# Patient Record
Sex: Female | Born: 1970 | Race: White | Hispanic: No | Marital: Married | Smoking: Former smoker
Health system: Southern US, Community
[De-identification: ages and names within clinical notes are randomized; demographics above are authoritative.]

## PROBLEM LIST (undated history)

## (undated) DIAGNOSIS — J309 Allergic rhinitis, unspecified: Secondary | ICD-10-CM

## (undated) DIAGNOSIS — F329 Major depressive disorder, single episode, unspecified: Secondary | ICD-10-CM

## (undated) DIAGNOSIS — K219 Gastro-esophageal reflux disease without esophagitis: Secondary | ICD-10-CM

## (undated) DIAGNOSIS — J45909 Unspecified asthma, uncomplicated: Secondary | ICD-10-CM

## (undated) DIAGNOSIS — F32A Depression, unspecified: Secondary | ICD-10-CM

## (undated) HISTORY — DX: Unspecified asthma, uncomplicated: J45.909

## (undated) HISTORY — DX: Major depressive disorder, single episode, unspecified: F32.9

## (undated) HISTORY — DX: Allergic rhinitis, unspecified: J30.9

## (undated) HISTORY — PX: CHOLECYSTECTOMY: SHX55

## (undated) HISTORY — PX: ABDOMINAL HYSTERECTOMY: SHX81

## (undated) HISTORY — DX: Gastro-esophageal reflux disease without esophagitis: K21.9

## (undated) HISTORY — DX: Depression, unspecified: F32.A

---

## 2006-05-24 ENCOUNTER — Emergency Department: Payer: Self-pay | Admitting: Emergency Medicine

## 2007-01-04 ENCOUNTER — Ambulatory Visit: Payer: Self-pay

## 2007-01-15 ENCOUNTER — Emergency Department: Payer: Self-pay | Admitting: General Practice

## 2007-03-14 ENCOUNTER — Ambulatory Visit: Payer: Self-pay | Admitting: Internal Medicine

## 2007-11-16 ENCOUNTER — Emergency Department: Payer: Self-pay | Admitting: Emergency Medicine

## 2008-02-22 ENCOUNTER — Emergency Department: Payer: Self-pay | Admitting: Emergency Medicine

## 2008-08-18 ENCOUNTER — Emergency Department: Payer: Self-pay

## 2011-03-10 ENCOUNTER — Ambulatory Visit: Payer: Self-pay

## 2011-07-09 ENCOUNTER — Emergency Department: Payer: Self-pay | Admitting: Unknown Physician Specialty

## 2013-06-26 ENCOUNTER — Emergency Department: Payer: Self-pay | Admitting: Emergency Medicine

## 2013-06-26 LAB — COMPREHENSIVE METABOLIC PANEL
Alkaline Phosphatase: 54 U/L
BUN: 11 mg/dL (ref 7–18)
Bilirubin,Total: 0.5 mg/dL (ref 0.2–1.0)
Chloride: 104 mmol/L (ref 98–107)
Creatinine: 0.62 mg/dL (ref 0.60–1.30)
EGFR (African American): >60
Glucose: 94 mg/dL (ref 65–99)
Osmolality: 275 (ref 275–301)
SGPT (ALT): 24 U/L (ref 12–78)
Total Protein: 6.7 g/dL (ref 6.4–8.2)

## 2013-06-26 LAB — TROPONIN I: Troponin-I: <0.02 ng/mL

## 2013-06-26 LAB — CBC
HCT: 34.3 % — ABNORMAL LOW (ref 35.0–47.0)
HGB: 11.8 g/dL — ABNORMAL LOW (ref 12.0–16.0)
MCH: 30.4 pg (ref 26.0–34.0)
Platelet: 202 10*3/uL (ref 150–440)
RBC: 3.89 10*6/uL (ref 3.80–5.20)
WBC: 7 10*3/uL (ref 3.6–11.0)

## 2013-07-11 ENCOUNTER — Emergency Department: Payer: Self-pay | Admitting: Emergency Medicine

## 2013-07-11 LAB — CBC
HCT: 35.8 % (ref 35.0–47.0)
HGB: 12.1 g/dL (ref 12.0–16.0)
MCH: 29.8 pg (ref 26.0–34.0)
MCHC: 33.8 g/dL (ref 32.0–36.0)
Platelet: 194 10*3/uL (ref 150–440)

## 2013-07-11 LAB — BASIC METABOLIC PANEL
BUN: 12 mg/dL (ref 7–18)
Calcium, Total: 8.5 mg/dL (ref 8.5–10.1)
Chloride: 104 mmol/L (ref 98–107)
Co2: 28 mmol/L (ref 21–32)
EGFR (African American): >60
Glucose: 118 mg/dL — ABNORMAL HIGH (ref 65–99)
Potassium: 3.7 mmol/L (ref 3.5–5.1)
Sodium: 137 mmol/L (ref 136–145)

## 2013-07-11 LAB — TROPONIN I: Troponin-I: <0.02 ng/mL

## 2014-03-30 ENCOUNTER — Emergency Department: Payer: Self-pay | Admitting: Emergency Medicine

## 2016-04-10 ENCOUNTER — Encounter (HOSPITAL_COMMUNITY): Payer: Self-pay

## 2016-04-23 ENCOUNTER — Ambulatory Visit (INDEPENDENT_AMBULATORY_CARE_PROVIDER_SITE_OTHER): Payer: Managed Care, Other (non HMO) | Admitting: Psychiatry

## 2016-04-23 DIAGNOSIS — F324 Major depressive disorder, single episode, in partial remission: Secondary | ICD-10-CM | POA: Diagnosis not present

## 2016-04-23 MED ORDER — DULOXETINE HCL 40 MG PO CPEP: 80.0000 mg | ORAL_CAPSULE | Freq: Every day | ORAL | 1 refills | Status: DC
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
## 2016-05-06 ENCOUNTER — Telehealth: Payer: Self-pay

## 2016-05-06 NOTE — Telephone Encounter (Signed)
pt states she not doing well on the new medication dosage.  pt wants it changed back.

## 2016-05-06 NOTE — Telephone Encounter (Signed)
called pt she needs 60mg  of duloxetine.  pt states that they have tired before so she wants to stay at 60mg .

## 2016-05-11 NOTE — Telephone Encounter (Signed)
per dr. Daleen Boravi pt will need to come in to make any medication changes.  Pt is very upset and mad because dr. Daleen Boravi would not just go ahead and make changes.  And pt asked why is she making me come back in .  Pt was offered a appt for Thursday but pt states she could not do that date.

## 2016-05-11 NOTE — Telephone Encounter (Signed)
pt called again. pt states she needs to know what to do about her medications.

## 2016-05-21 ENCOUNTER — Ambulatory Visit: Payer: Managed Care, Other (non HMO) | Admitting: Psychiatry

## 2018-07-13 ENCOUNTER — Ambulatory Visit (INDEPENDENT_AMBULATORY_CARE_PROVIDER_SITE_OTHER): Payer: BLUE CROSS/BLUE SHIELD | Admitting: Physician Assistant

## 2018-07-13 ENCOUNTER — Encounter: Payer: Self-pay | Admitting: Physician Assistant

## 2018-07-13 DIAGNOSIS — K219 Gastro-esophageal reflux disease without esophagitis: Secondary | ICD-10-CM

## 2018-07-13 DIAGNOSIS — G4700 Insomnia, unspecified: Secondary | ICD-10-CM

## 2018-07-13 DIAGNOSIS — F331 Major depressive disorder, recurrent, moderate: Secondary | ICD-10-CM

## 2018-07-13 MED ORDER — ARIPIPRAZOLE 2 MG PO TABS: ORAL_TABLET | ORAL | 1 refills | Status: DC

## 2018-07-13 MED ORDER — TRAZODONE HCL 50 MG PO TABS: 25.0000 mg | ORAL_TABLET | Freq: Every evening | ORAL | 1 refills | Status: DC | PRN

## 2018-07-13 NOTE — Progress Notes (Signed)
[facility] MD/PA/NP Initial Note  07/13/2018 6:02 PM Wendy SlateMary K Wright  MRN:  [id number]  Chief Complaint:  Chief Complaint    Depression; ADD      HPI: c/o depression her whole life.  Never happy.  Worse in the past year.  Being treated by PCP. Having marital issues. He works out of state so it's been very stressful.   Patient has loss of interest in usual activities and is unable to enjoy things.  This hasn't really gotten any worse.  Has decreased energy and motivation.  Appetite has not changed.  c/o sadness, tearfulness.  C/o changes in concentration, and  remembering things. "I've always been that way."  Never dx with ADD/ADHD. Denies suicidal or homicidal thoughts. Has been on Cymbalta for 20 years and is nervous to try anything else. Dose was increased at one point and she didn't tolerate it.   Patient reports increased energy with decreased need for sleep,  increased talkativeness.  These sx seem to come and go with seasons. Has racing thoughts mostly at night,  denies impulsivity or risky behaviors, no increased spending, no increased libido, no grandiosity.   Trouble sleeping.  Has racing thoughts in the evening and can't turn her mind off. Almost every night. Sometimes uses OTC med which helps and has had Ambien in the past which helped.  Gets about 6 hours a night now.  Misses the sleep.  Visit Diagnosis:    ICD-10-CM   1. Major depressive disorder, recurrent episode, moderate (HCC) F33.1   2. Insomnia, unspecified type G47.00   3. Gastroesophageal reflux disease, esophagitis presence not specified K21.9     Past Psychiatric History:  Past medications for mental health diagnoses include: Prozac, Lexapro, Zoloft, Paxil, Effexor, Wellbutrin, Ambien, Melatonin,   Past Medical History:  Past Medical History:  Diagnosis Date  . Asthma   . Chronic allergic rhinitis   . Depression   . GERD (gastroesophageal reflux disease)    No hospitalizations for mental health reasons    Past Surgical History:  Procedure Laterality Date  . ABDOMINAL HYSTERECTOMY    . CHOLECYSTECTOMY      Family Psychiatric History:   Family History:  Family History  Problem Relation Age of Onset  . Alcohol abuse Father   . Cirrhosis Father   . Dementia Paternal Grandmother     Social History:  Social History   Socioeconomic History  . Marital status: Married    Spouse name: Casimiro NeedleMichael  . Number of children: 3  . Years of education: Not on file  . Highest education level: GED or equivalent  Occupational History  . Occupation: Retail    Comment: [employer]  Social Needs  . Financial resource strain: Not hard at all  . Food insecurity:    Worry: Never true    Inability: Never true  . Transportation needs:    Medical: No    Non-medical: No  Tobacco Use  . Smoking status: Former Smoker    Types: Cigarettes  . Smokeless tobacco: Never Used  Substance and Sexual Activity  . Alcohol use: Yes    Comment: occasionally   . Drug use: Not Currently    Types: Marijuana  . Sexual activity: Yes    Birth control/protection: Surgical  Lifestyle  . Physical activity:    Days per week: 0 days    Minutes per session: 0 min  . Stress: To some extent  Relationships  . Social connections:    Talks on phone:  Never    Gets together: Never    Attends religious service: 1 to 4 times per year    Active member of club or organization: Yes    Attends meetings of clubs or organizations: Never    Relationship status: Married  Other Topics Concern  . Not on file  Social History Narrative   No h/o sexual abuse   No legal issues ever.      Grew up with Mom and GM.  Never abused.    Believes in God but doesn't go to church.   Has 1/2 brother and 1/2 sister.       Lives with husband and 2 kids. Married x 26 years.    Having marital issues.  Husband is in [place] for job.    Allergies: No Known Allergies  Metabolic Disorder Labs: No results found for: HGBA1C, MPG No  results found for: PROLACTIN No results found for: CHOL, TRIG, HDL, CHOLHDL, VLDL, LDLCALC No results found for: TSH  Therapeutic Level Labs: No results found for: LITHIUM No results found for: VALPROATE No components found for:  CBMZ  Current Medications: Current Outpatient Medications  Medication Sig Dispense Refill  . DULoxetine (CYMBALTA) 60 MG capsule Take 60 mg by mouth daily.    . fexofenadine (ALLEGRA) 180 MG tablet Take 180 mg by mouth daily.    Marland Kitchen omeprazole (PRILOSEC) 20 MG capsule Take 20 mg by mouth daily.    . ARIPiprazole (ABILIFY) 2 MG tablet 1 po q am for a week, then 2 q am. 60 tablet 1  . traZODone (DESYREL) 50 MG tablet Take 0.5-1 tablets (25-50 mg total) by mouth at bedtime as needed for sleep. 30 tablet 1   No current facility-administered medications for this visit.     Medication Side Effects: none  Orders placed this visit:  No orders of the defined types were placed in this encounter.   Psychiatric Specialty Exam:  ROS  There were no vitals taken for this visit.There is no height or weight on file to calculate BMI.  General Appearance: Casual and Well Groomed  Eye Contact:  Good  Speech:  Clear and Coherent  Volume:  Normal  Mood:  Euthymic  Affect:  Appropriate  Thought Process:  Goal Directed  Orientation:  Full (Time, Place, and Person)  Thought Content: Logical   Suicidal Thoughts:  No  Homicidal Thoughts:  No  Memory:  WNL  Judgement:  Good  Insight:  Good  Psychomotor Activity:  Normal  Concentration:  Concentration: Good and Attention Span: Good  Recall:  Good  Fund of Knowledge: Good  Language: Good  Assets:  Communication Skills  ADL's:  Intact  Cognition: WNL  Prognosis:  Good   Screenings:  GAD-7     Counselor from 07/13/2018 in [facility]  Total GAD-7 Score  10    PHQ2-9     Counselor from 07/13/2018 in [facility]  PHQ-2 Total Score  4  PHQ-9 Total Score  17      Receiving  Psychotherapy: Yes saw a counselor once at [facility].   Treatment Plan/Recommendations: We will continue Cymbalta 60 mg.  If it had not been tried before, I would increase the dose, however she states she did not tolerate it well. Added Abilify 2 mg p.o. every morning for 1 week and then 2 p.o. every morning. Discussed potential metabolic side effects associated with atypical antipsychotics.  Labs will need to be drawn periodically to monitor metabolic function. Discussed potential  risk for movement side effects. Patient understands and accepts these risks and has been advised to contact office if any movement side effects occur.  Add trazodone 50 mg half to 1 p.o. nightly as needed sleep. We discussed good sleep hygiene and asked her to put those practices in place. Either continue counseling at [facility] or see someone here.  She had ask about that and might make an appointment.  She is unsure. Return in 4 to 6 weeks or sooner as needed.    [name], PA-C

## 2018-07-15 ENCOUNTER — Encounter: Payer: Self-pay | Admitting: Psychiatry

## 2018-07-21 ENCOUNTER — Telehealth: Payer: Self-pay

## 2018-07-21 NOTE — Telephone Encounter (Signed)
Prior authorization approval through [facility], for aripiprazole 2mg  through 07/18/2019.

## 2018-08-23 ENCOUNTER — Ambulatory Visit: Payer: BLUE CROSS/BLUE SHIELD | Admitting: Physician Assistant

## 2018-08-23 ENCOUNTER — Encounter: Payer: Self-pay | Admitting: Physician Assistant

## 2018-08-23 DIAGNOSIS — G4700 Insomnia, unspecified: Secondary | ICD-10-CM | POA: Diagnosis not present

## 2018-08-23 DIAGNOSIS — F331 Major depressive disorder, recurrent, moderate: Secondary | ICD-10-CM | POA: Diagnosis not present

## 2018-08-23 MED ORDER — BUPROPION HCL 75 MG PO TABS: 75.0000 mg | ORAL_TABLET | Freq: Two times a day (BID) | ORAL | 1 refills | Status: DC

## 2018-08-23 NOTE — Progress Notes (Signed)
[facility] Med Check  Patient ID: Wendy Wright,  MRN: [id number]  PCP: Lauro Regulus, MD  Date of Evaluation: 08/23/2018 Time spent:15 minutes  Chief Complaint:  Chief Complaint    Follow-up      HISTORY/CURRENT STATUS: HPI Wasn't able to get the Abilify until 08/04/18.  Wants to eat all the time. Spending more money.  Also wants to eat a lot of sugar and feels that sets of a depression that lasts 1-3 mins 'until I can talk myself out of it.'  Abilify doesn't seem to be working except she doesn't get upset about little things like she did. Some things she just doesn't pay attn to, like the music at work. So, not as irritable as before.    Patient denies increased energy with decreased need for sleep, no increased talkativeness, no racing thoughts, no impulsivity or risky behaviors, has increased spending but that's new,  no increased libido, no grandiosity.  Abilify makes her sleepy. And compulsively eating. Has gained 6 #.  Doesn't feel like the med is working well enough to put up with the side effects.   States she's more forgetful. C/o fatigue (not worse) and not able to exercise. Goes to sleep okay but doesn't stay asleep.  Wakes up a lot. Hasn't tried the trazodone, b/c she can go to sleep ok.   No hx Seizures or eating d/o.  Individual Medical History/ Review of Systems: Changes? :No    Past medications for mental health diagnoses include: Cymbalta, Prozac, Lexapro, Zoloft, Paxil, Effexor, Wellbutrin, Ambien, Melatonin, Buspar  Allergies: Patient has no known allergies.  Current Medications:  Current Outpatient Medications:  .  DULoxetine (CYMBALTA) 60 MG capsule, Take 60 mg by mouth daily., Disp: , Rfl:  .  fexofenadine (ALLEGRA) 180 MG tablet, Take 180 mg by mouth daily., Disp: , Rfl:  .  omeprazole (PRILOSEC) 20 MG capsule, Take 20 mg by mouth daily., Disp: , Rfl:  .  traZODone (DESYREL) 50 MG tablet, Take 0.5-1 tablets (25-50 mg total) by mouth at bedtime  as needed for sleep. (Patient not taking: Reported on 08/23/2018), Disp: 30 tablet, Rfl: 1 Medication Side Effects: weight gain  Family Medical/ Social History: Changes? No  MENTAL HEALTH EXAM:  There were no vitals taken for this visit.There is no height or weight on file to calculate BMI.  General Appearance: Casual and Well Groomed  Eye Contact:  Good  Speech:  Clear and Coherent and Garbled  Volume:  Normal  Mood:  Euthymic  Affect:  Appropriate  Thought Process:  Goal Directed  Orientation:  Full (Time, Place, and Person)  Thought Content: Logical   Suicidal Thoughts:  No  Homicidal Thoughts:  No  Memory:  WNL  Judgement:  Good  Insight:  Good  Psychomotor Activity:  Normal  Concentration:  Concentration: Good  Recall:  Good  Fund of Knowledge: Good  Language: Good  Assets:  Desire for Improvement  ADL's:  Intact  Cognition: WNL  Prognosis:  Good    DIAGNOSES:    ICD-10-CM   1. Major depressive disorder, recurrent episode, moderate (HCC) F33.1   2. Insomnia, unspecified type G47.00     Receiving Psychotherapy: Yes  Counselor at [facility] but hasn't been able to go back since last visit with me.   RECOMMENDATIONS: Decrease the Abilify 2 mg 1 po qam for 1 week, then stop. Start Wellbutrin 75mg  q am then 1 bid.  Cont Cymbalta 60 mg. (hasn't been able to increase) Cont Trazodone 50mg   1/2-1 qhs prn sleep. Sleep hygiene discussed. Strongly recommend seeing counselor. Return in 6 weeks.    Melony Overlyeresa Kassidee Narciso, PA-C

## 2018-08-23 NOTE — Patient Instructions (Signed)
Decrease Abilify to 2 mg, one daily for a week and then stop.

## 2018-09-15 ENCOUNTER — Ambulatory Visit (INDEPENDENT_AMBULATORY_CARE_PROVIDER_SITE_OTHER): Payer: BLUE CROSS/BLUE SHIELD | Admitting: Psychiatry

## 2018-09-15 DIAGNOSIS — F331 Major depressive disorder, recurrent, moderate: Secondary | ICD-10-CM | POA: Diagnosis not present
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
## 2018-09-20 ENCOUNTER — Ambulatory Visit: Payer: BLUE CROSS/BLUE SHIELD | Admitting: Psychiatry

## 2018-09-23 ENCOUNTER — Ambulatory Visit (INDEPENDENT_AMBULATORY_CARE_PROVIDER_SITE_OTHER): Payer: BLUE CROSS/BLUE SHIELD | Admitting: Psychiatry

## 2018-09-23 DIAGNOSIS — F331 Major depressive disorder, recurrent, moderate: Secondary | ICD-10-CM

## 2018-09-23 NOTE — Progress Notes (Signed)
      [facility] Counselor/Therapist Progress Note  Patient ID: Wendy Wright, MRN: [id number],    Date: 09/23/2018  Time Spent:  58 minutes  Treatment Type: Individual Therapy  Reported Symptoms:  Depression, anxiety, sadness, "fluctuating emotions, grief, anger,   Mental Status Exam:  Appearance:   Casual     Behavior:  Appropriate and Sharing  Motor:  Normal  Speech/Language:   Normal Rate  Affect:  Congruent  Mood:  anxious, depressed and sad  Thought process:  normal  Thought content:    WNL  Sensory/Perceptual disturbances:    WNL  Orientation:  oriented to person, place, time/date, situation, day of week and month of year  Attention:  Good  Concentration:  Fair  Memory:  WNL  Fund of knowledge:   Good  Insight:    Fair  Judgment:   Good  Impulse Control:  Fair   Risk Assessment: Danger to Self:  No Self-injurious Behavior: No Danger to Others: No Duty to Warn:no Physical Aggression / Violence:No  Access to Firearms a concern: No  Gang Involvement:No   Subjective:  Patient in today reporting symptoms above. Very tearful, "I'm back to square 1".  Issues within marriage---we processed at length her sadness, anger,distrust, frustration.  Lengthy processing of her feelings to help her vent and then eventually get more grounded. Denies any SI / HI.  Talked about strategies that can help her through sharp emotions, as well as the need to not assume the worst in situations that are out in the future and to think of other supports she has instead of relying primarily on her children for support especially on marital issues.  Goal review.  Interventions: Cognitive Behavioral Therapy, Solution-Oriented/Positive Psychology and Ego-Supportive  Diagnosis:   ICD-10-CM   1. Major depressive disorder, recurrent episode, moderate (HCC) F33.1     Plan:  Patient to follow through on practicing strategies for calming herself, decreasing anxiety, and getting support from others  than her children.  Denies SI / HI.  To return within 1-2 wks.  Mathis Fare, LCSW

## 2018-10-05 ENCOUNTER — Ambulatory Visit (INDEPENDENT_AMBULATORY_CARE_PROVIDER_SITE_OTHER): Payer: BLUE CROSS/BLUE SHIELD | Admitting: Psychiatry

## 2018-10-05 ENCOUNTER — Encounter: Payer: Self-pay | Admitting: Physician Assistant

## 2018-10-05 ENCOUNTER — Ambulatory Visit (INDEPENDENT_AMBULATORY_CARE_PROVIDER_SITE_OTHER): Payer: BLUE CROSS/BLUE SHIELD | Admitting: Physician Assistant

## 2018-10-05 DIAGNOSIS — G4700 Insomnia, unspecified: Secondary | ICD-10-CM | POA: Diagnosis not present

## 2018-10-05 DIAGNOSIS — F331 Major depressive disorder, recurrent, moderate: Secondary | ICD-10-CM

## 2018-10-05 MED ORDER — DULOXETINE HCL 30 MG PO CPEP: 30.0000 mg | ORAL_CAPSULE | Freq: Every day | ORAL | 1 refills | Status: DC

## 2018-10-05 MED ORDER — BUPROPION HCL 75 MG PO TABS: 37.5000 mg | ORAL_TABLET | Freq: Every day | ORAL | 1 refills | Status: DC

## 2018-10-05 NOTE — Progress Notes (Signed)
      [facility] Counselor/Therapist Progress Note  Patient ID: SYBOL PAICE, MRN: [id number],    Date: 10/05/2018  Time Spent:  60 minutes  Treatment Type: Individual Therapy  Reported Symptoms:  Depression, anxiety, sadness, "fluctuating emotions, grief, anger,   Mental Status Exam:  Appearance:   Casual     Behavior:  Appropriate and Sharing  Motor:  Normal  Speech/Language:   Normal Rate  Affect:  Congruent  Mood:  anxious, depressed and sad  Thought process:  normal  Thought content:    WNL  Sensory/Perceptual disturbances:    WNL  Orientation:  oriented to person, place, time/date, situation, day of week and month of year  Attention:  Good  Concentration:  Fair  Memory:  WNL  Fund of knowledge:   Good  Insight:    Fair  Judgment:   Good  Impulse Control:  Fair   Risk Assessment: Danger to Self:  No Self-injurious Behavior: No Danger to Others: No Duty to Warn:no Physical Aggression / Violence:No  Access to Firearms a concern: No  Gang Involvement:No   Subjective:  Patient in today with reported symptoms above.  Less tearfulness as she processed her depression, anxiety, anger,distrust, frustration.  Also has a work situation that is very sensitive and causing her lots of distress.    Can get overwhelmed, so we narrowed down her 2 more important and stressful situations and established most immediate goals: 1. Refrain from talking with her children (ages 63 and 20) about the ongoing problems with patient and husband (dad of the kids).  Both kids also have "their own issues" and are getting help. 2. Make decision on how she wants to handle work situation.  Talked about strategies that can help her through sharp emotions, as well as the need to not assume the worst in situations that are out in the future and to think of other supports she has instead of relying primarily on her children for support especially on marital issues.  Goal review.  Interventions:  Cognitive Behavioral Therapy, Solution-Oriented/Positive Psychology and Ego-Supportive  Diagnosis:   ICD-10-CM   1. Major depressive disorder, recurrent episode, moderate (HCC) F33.1     Plan:  Patient to follow through on practicing strategies for calming herself, decreasing anxiety, and getting support from others than her children.  Denies SI / HI.  To return within 1-2 wks.  Mathis Fare, LCSW

## 2018-10-05 NOTE — Progress Notes (Signed)
[facility] Med Check  Patient ID: Wendy Wright,  MRN: [id number]  PCP: Lauro Regulus, MD  Date of Evaluation: 10/05/18 Time spent:15 minutes  Chief Complaint:  Chief Complaint    Follow-up; Depression; Anxiety; Insomnia      HISTORY/CURRENT STATUS: HPI Here for routine med check.  At LOV, we weaned off the Abilify.  She has been eating a lot of junk food.  Wants to sleep a lot. Went to [employer] recently to [employer]. Feels like she's about 25%-50% better. The Wellbutrin has kept her awake even 1 pill every morning.  She didn't read the directions on the bottle so never increased to twice daily.  States she does not enjoy things.  Says she is not doing anything or going out of the house, but then says she does things with her family and goes out with them sometimes.  Her energy and motivation are still low but slightly better than before she started on treatment.  Denies suicidal or homicidal thoughts.  Patient denies increased energy with decreased need for sleep, no increased talkativeness, no racing thoughts, no impulsivity or risky behaviors, no increased spending, no increased libido, no grandiosity.  Still not sleeping very well.  She took one trazodone and says it gave her the worst headache of her life so she never took it again.  She has not had any more headaches since that time.  Denies muscle or joint pain, stiffness, or dystonia.  Denies dizziness, syncope, seizures, numbness, tingling, tremor, tics, unsteady gait, slurred speech, confusion.   Individual Medical History/ Review of Systems: Changes? :No    Past medications for mental health diagnoses include: Cymbalta, Prozac, Lexapro, Zoloft, Paxil, Effexor, Wellbutrin, Ambien, Melatonin, Buspar, trazodone caused headache  Allergies: Patient has no known allergies.  Current Medications:  Current Outpatient Medications:  .  buPROPion (WELLBUTRIN) 75 MG tablet, Take 0.5 tablets (37.5 mg total) by mouth daily  after breakfast., Disp: 60 tablet, Rfl: 1 .  DULoxetine (CYMBALTA) 60 MG capsule, Take 60 mg by mouth daily., Disp: , Rfl:  .  fexofenadine (ALLEGRA) 180 MG tablet, Take 180 mg by mouth daily., Disp: , Rfl:  .  omeprazole (PRILOSEC) 20 MG capsule, Take 20 mg by mouth daily., Disp: , Rfl:  .  DULoxetine (CYMBALTA) 30 MG capsule, Take 1 capsule (30 mg total) by mouth daily. Take with 60mg =90mg  q am., Disp: 30 capsule, Rfl: 1 .  traZODone (DESYREL) 50 MG tablet, Take 0.5-1 tablets (25-50 mg total) by mouth at bedtime as needed for sleep. (Patient not taking: Reported on 08/23/2018), Disp: 30 tablet, Rfl: 1 Medication Side Effects: headache from trazodone.  Family Medical/ Social History: Changes? No  MENTAL HEALTH EXAM:  There were no vitals taken for this visit.There is no height or weight on file to calculate BMI.  General Appearance: Casual and Well Groomed  Eye Contact:  Good  Speech:  Clear and Coherent  Volume:  Normal  Mood:  Euthymic  Affect:  Appropriate  Thought Process:  Goal Directed  Orientation:  Full (Time, Place, and Person)  Thought Content: Logical   Suicidal Thoughts:  No  Homicidal Thoughts:  No  Memory:  WNL  Judgement:  Good  Insight:  Good  Psychomotor Activity:  Normal  Concentration:  Concentration: Good and Attention Span: Good  Recall:  Good  Fund of Knowledge: Good  Language: Good  Assets:  Desire for Improvement  ADL's:  Intact  Cognition: WNL  Prognosis:  Good    DIAGNOSES:  ICD-10-CM   1. Major depressive disorder, recurrent episode, moderate (HCC) F33.1   2. Insomnia, unspecified type G47.00     Receiving Psychotherapy: Yes With Rockne Menghini, LCSW.   RECOMMENDATIONS: Recommend decrease Wellbutrin to 37.5 mg p.o. daily after breakfast. Increase Cymbalta to a total of 90 mg daily.  Initially, she had said she tried that and was unable to tolerate it.  But now she cannot remember if it was Cymbalta or another drug.  She agreed to try to bump  up the dose again.  I encouraged her to try to work through any side effects such as headache, nausea, diarrhea, if they occur.  Of course if she has rash, tongue swelling, difficulty breathing, she should stop the medication and call right away.  She verbalizes understanding. Discussed sleep hygiene.  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  So the symptoms of inability to sleep may not be new or different.  Continue psychotherapy with Rockne Menghini, LCSW. Return in 6 weeks.   [name], PA-C   This record has been created using [employer].  Chart creation errors have been sought, but may not always have been located and corrected. Such creation errors do not reflect on the standard of medical care.

## 2018-10-10 ENCOUNTER — Telehealth: Payer: Self-pay | Admitting: Physician Assistant

## 2018-10-10 NOTE — Telephone Encounter (Signed)
Increased dose of dulextine but she stopped taking it today. Yesterday was a bad day for her. She stated she was having suicidal thoughts. Please return call.

## 2018-10-10 NOTE — Telephone Encounter (Signed)
Go to ER if active SI. She cannot stop Cymbalta cold [place].  The SI are not necessarily from the med.  I recommend she stay on it at current dose.  But go to ER if SI are present.

## 2018-10-11 ENCOUNTER — Ambulatory Visit (INDEPENDENT_AMBULATORY_CARE_PROVIDER_SITE_OTHER): Payer: BLUE CROSS/BLUE SHIELD | Admitting: Psychiatry

## 2018-10-11 ENCOUNTER — Telehealth: Payer: Self-pay | Admitting: Physician Assistant

## 2018-10-11 DIAGNOSIS — F331 Major depressive disorder, recurrent, moderate: Secondary | ICD-10-CM | POA: Diagnosis not present

## 2018-10-11 NOTE — Telephone Encounter (Signed)
Kind of confusing, but pt took duloxetine 90 mg as instructed on 03/05 for 5 days, and forgot to decrease the wellbutrin 75 mg as provider recemmended. Started feeling awful then realized about 5-6 months ago she had tried to increase her duloxetine to 80 mg/day and it ended up doing the same effects with depression/SI, she had forgotten until this increase.  She went back down to duloxetine 60 mg as previously and taking wellbutrin 75 mg 1 a day. Will start the 1/2 tablet of Wellbutrin tomorrow but no increase in duloxetine due to how it made her feel.   Has follow up 11/01/2018

## 2018-10-11 NOTE — Telephone Encounter (Signed)
Ok, this plan is fine.

## 2018-10-11 NOTE — Progress Notes (Signed)
[facility] Counselor/Therapist Progress Note  Patient ID: Wendy Wright, MRN: [id number],    Date: 10/11/2018  Time Spent:  60 minutes  Treatment Type: Individual Therapy  Reported Symptoms:  Depression, anxiety, sadness, "fluctuating emotions, grief, anger,   Mental Status Exam:  Appearance:   Casual     Behavior:  Appropriate and Sharing  Motor:  Normal  Speech/Language:   Normal Rate  Affect:  Congruent  Mood:  Anxious, depressed  Thought process:  normal  Thought content:    WNL  Sensory/Perceptual disturbances:    WNL  Orientation:  oriented to person, place, time/date, situation, day of week and month of year  Attention:  Good one of one with people. "not as good in groups"  Concentration:  Fair  Memory:  Today reports significant memory issues; did not report this last visit  Fund of knowledge:   Good  Insight:    Fair  Judgment:   Fair  Impulse Control:  Fair   Risk Assessment: Danger to Self:  No Self-injurious Behavior: No Danger to Others: No Duty to Warn:no Physical Aggression / Violence:No  Access to Firearms a concern: No  Gang Involvement:No   Subjective:   Reviewed homework assigned from last session.  Patient states she forgot and has had more emotionally volatile time recently. Some suicidal thoughts (no plans).  She did call in to office and her medication was changed per med provider.   Less tearfulness noted today and smiles more.  Still experiencing anxiety and depression.  States today "depression" is her biggest concern "but it can change from day to day".  Uses a Lot of "what if's" in her thinking and that continuously stresses her and contributes to her depression and negativity.  Recent contact with husband who is out of state.  He may be taking different job that may affect patient which she openly shared at length.  Encouraged patient to write out list of questions she has for husband in regards to changes that may occur if he takes new  job and how this may impact patient.  Tried to help patient separate out her thoughts and determine some direction for positive change.   Also has a work situation that is very sensitive and causing her lots of distress.    Can get overwhelmed, so we narrowed down her 3  more important and stressful situations and established most immediate goals: 1. Refrain from talking with her children (ages 60 and 62) about the ongoing problems with patient and husband (dad of the kids).  Both kids also have "their own issues" and are getting help. 2. Make decision on how she wants to handle work situation. 3. Stop the "what if's" in her thoughts, realizing they are "just thoughts".  Talked about strategies that can help her through sharp emotions, as well as the need to not assume the worst in situations that are out in the future and to think of other supports she has instead of relying primarily on her children for support especially on marital issues.  Goal review.  Interventions: Cognitive Behavioral Therapy, Solution-Oriented/Positive Psychology and Ego-Supportive  Diagnosis:   ICD-10-CM   1. Major depressive disorder, recurrent episode, moderate (HCC) F33.1     Plan:  Patient to follow through on practicing strategies for calming herself, not assuming the negative versus positive, stopping the negative "what if's",  decreasing anxiety, and getting support from others than her children.  Denies SI / HI.  To return within  1-2 wks.  Mathis Fare, LCSW

## 2018-10-11 NOTE — Telephone Encounter (Signed)
Patient was in today and said that she is still

## 2018-10-11 NOTE — Telephone Encounter (Signed)
Patient said that she is taking the cymbalta at regular dose not the increased dose. Also she wanted you to know that she will cut the wellbutrin in half and start taking that tomorrow.

## 2018-11-01 ENCOUNTER — Ambulatory Visit: Payer: BLUE CROSS/BLUE SHIELD | Admitting: Physician Assistant

## 2018-11-01 ENCOUNTER — Ambulatory Visit: Payer: BLUE CROSS/BLUE SHIELD | Admitting: Psychiatry

## 2018-11-15 ENCOUNTER — Ambulatory Visit: Payer: BLUE CROSS/BLUE SHIELD | Admitting: Psychiatry

## 2018-11-29 ENCOUNTER — Other Ambulatory Visit: Payer: Self-pay

## 2018-11-29 ENCOUNTER — Ambulatory Visit (INDEPENDENT_AMBULATORY_CARE_PROVIDER_SITE_OTHER): Payer: BLUE CROSS/BLUE SHIELD | Admitting: Psychiatry

## 2018-11-29 DIAGNOSIS — F331 Major depressive disorder, recurrent, moderate: Secondary | ICD-10-CM | POA: Diagnosis not present

## 2018-11-29 NOTE — Progress Notes (Signed)
[facility] Counselor/Therapist Progress Note  Patient ID: Wendy SlateMary K Freund, MRN: [id number],    Date: 11/29/2018  Time Spent:  60 minutes    11:00am to 12:00noon  Treatment Type: Individual Therapy   Virtual Visit via Telephone Note Connected with patient by a video enabled telemedicine/telehealth application or telephone, with their informed consent, and verified patient privacy and that I am speaking with the correct person using two identifiers. [facility] and patient is at home.  I discussed the limitations, risks, security and privacy concerns of performing psychotherapy and management service by telephone and the availability of in person appointments. I also discussed with the patient that there may be a patient responsible charge related to this service. The patient expressed understanding and agreed to proceed. I discussed the treatment planning with the patient. The patient was provided an opportunity to ask questions and all were answered. The patient agreed with the plan and demonstrated an understanding of the instructions. The patient was advised to call  our office if  symptoms worsen or feel they are in a crisis state and need immediate contact.  Therapist Location: home Patient Location: home  Start time: 11:00am Stop time:12noon  Reported Symptoms:  Depression, anxiety, sadness, "fluctuating emotions, grief, anger,   Mental Status Exam:  Appearance:    N/A  (telehealth)  Behavior:  Appropriate and Sharing  Motor:  Normal  Speech/Language:   Normal Rate  Affect:  N/A   (telehealth)  Mood:  Anxious, depressed  Thought process:  normal  Thought content:    WNL  Sensory/Perceptual disturbances:    WNL  Orientation:  oriented to person, place, time/date, situation, day of week and month of year  Attention:  Good one of one with people. "not as good in groups"  Concentration:  Fair  Memory:  Today reports significant memory issues; did not  report this last visit  Fund of knowledge:   Good  Insight:    Fair  Judgment:   Fair  Impulse Control:  Fair   Risk Assessment: Danger to Self:  No Self-injurious Behavior: No Danger to Others: No Duty to Warn:no Physical Aggression / Violence:No  Access to Firearms a concern: No  Gang Involvement:No   Subjective:    Patient reports being furloughed at work so she could draw unemployment. "So my work issues are not a problem right now."    Emotionally says she's been "rollercoastering" some meaning "off and on, high and low, more directed towards husband".  States she is more leveled out now and trying to communicate with him. She reports he is not communicating well. Continues to have "issues with husband including communication, trust, and uncertainty. "What I want from him, he can't give me, and that's emotional support and care about me."  "I think he is so over us and I don't ever see it changing."  "I don't think he is capable of being good to us."  Also "I wish I was the type of person who could be happy alone and I can't." Processed this at length and states that "nobody will probably ever want her". Challeged her on this and she explained "I'm just messed up mentally".  Went on to explain most of her perceived dysfunction, whether true or not, as being in relation to current husband.  States "I have never been in a healthy relationship with anyone and I don't even know what one is."  "Wants to be needed and liked, no  matter what, and I rationalize a lot of things." Discussed these things more with patient and agreed that we need to focus on this in part of our therapy time together.   Still uses a lot of "what if's" in her thinking and that continuously stresses her and contributes to her depression and negativity.    (Reviewed with patient today)  Can get overwhelmed, so we narrowed down her 3  more important and stressful situations and established most immediate goals: 1.  Refrain from talking with her children (ages 37 and 46) about the ongoing problems with patient and husband (dad of the kids).  Both kids also have "their own issues" and are getting help. 2. Make decision on how she wants to handle work situation. 3. Lessen the "what if's" in her thoughts, realizing they are "just thoughts".  Reviewed strategies that can help her through sharp emotions, as well as the need to not assume the worst in situations that are out in the future and to think of other supports she has instead of relying primarily on her children for support especially on marital issues.  Goal review with patient and some progress noted.  Interventions: Cognitive Behavioral Therapy, Solution-Oriented/Positive Psychology and Ego-Supportive  Diagnosis:   ICD-10-CM   1. Major depressive disorder, recurrent episode, moderate (HCC) F33.1     Plan:  Patient to follow through on practicing strategies for self-care and calming herself, not assuming the negative versus positive, lessening the negative "what if's",  decreasing anxiety, and getting support from others than her children.  Denies SI / HI.  Next appt within 1-2 wks.  Mathis Fare, LCSW

## 2018-12-13 ENCOUNTER — Ambulatory Visit: Payer: BLUE CROSS/BLUE SHIELD | Admitting: Psychiatry

## 2018-12-22 ENCOUNTER — Other Ambulatory Visit: Payer: Self-pay

## 2018-12-22 ENCOUNTER — Ambulatory Visit (INDEPENDENT_AMBULATORY_CARE_PROVIDER_SITE_OTHER): Payer: BLUE CROSS/BLUE SHIELD | Admitting: Psychiatry

## 2018-12-22 DIAGNOSIS — F331 Major depressive disorder, recurrent, moderate: Secondary | ICD-10-CM

## 2018-12-22 NOTE — Progress Notes (Signed)
[facility] Counselor/Therapist Progress Note  Patient ID: PAISYN YOUNGDAHL, MRN: [id number],    Date: 12/22/2018  Time Spent:  60 minutes    9:00am to 10:00am  Treatment Type: Individual Therapy   Virtual Visit Note Connected with patient by a video enabled telemedicine/telehealth application or telephone, with their informed consent, and verified patient privacy and that I am speaking with the correct person using two identifiers. I am at [facility] and patient is at home.  I discussed the limitations, risks, security and privacy concerns of performing psychotherapy and management service by telephone and the availability of in person appointments. I also discussed with the patient that there may be a patient responsible charge related to this service. The patient expressed understanding and agreed to proceed. I discussed the treatment planning with the patient. The patient was provided an opportunity to ask questions and all were answered. The patient agreed with the plan and demonstrated an understanding of the instructions. The patient was advised to call  our office if  symptoms worsen or feel they are in a crisis state and need immediate contact.   Reported Symptoms:  Depression, anxiety, sadness, "fluctuating emotions, grief, anger, "upset over the COVID-19 and fearing she may have it already"  Mental Status Exam:  Appearance:    N/A  (telehealth)  Behavior:  Appropriate and Sharing  Motor:  Normal  Speech/Language:   Normal Rate  Affect:  N/A   (telehealth)  Mood:  Anxious, depressed  Thought process:  normal  Thought content:    WNL  Sensory/Perceptual disturbances:    WNL  Orientation:  oriented to person, place, time/date, situation, day of week and month of year  Attention:  Good one of one with people. "not as good in groups"  Concentration:  Fair  Memory:  Today reports significant memory issues; did not report this last visit  Fund of knowledge:    Good  Insight:    Fair  Judgment:   Fair  Impulse Control:  Fair   Risk Assessment: Danger to Self:  No Self-injurious Behavior: No Danger to Others: No Duty to Warn:no Physical Aggression / Violence:No  Access to Firearms a concern: No  Gang Involvement:No   Subjective:    Patient reports symptoms above and that she is still furloughed and is "afraid I might have the virus".  Has not checked with her doctor yet, and Did Promise to contact doctor's office today after our appt.  Is doing better financially getting unemployment than she was at her job, which was a stressful environment for patient. Turned down offer from employer to return to work as she doesn't feel ready to return yet "due to fear of virus".   Acknowledged and agreed with me that she's watching too much of catastrophic "world news on TV and online", and she agrees to decrease it.  Also agrees that she needs to get outside at least some daily and have some physical exercise as well.  Vented lots of frustrations re: family, husband, work, coronavirus.  Worked to see what she can control/change and what she cannot.  Lots of anxiety about each of these so the focus was strategies to help her manage the anxiety and also trying to keep some issues separate as they don't overwhelm her as much when separated.  Specific issues re: husband discussed more at length and patient agreed that if she speaks with him, she needs to be very mindful of her communication in order to  best be heard, and provided her with example of positive vs negative communication. Uses a lot of "what if's" in her thinking and that continuously stresses her and contributes to her depression and negativity, so we discussed trying to decrease the what-if's.  (Reviewed with patient again today)  Can get overwhelmed, so we narrowed down her 3  more important and stressful situations and established most immediate goals: 1. Refrain from talking with her children  (ages 3719 and 4028) about the ongoing problems with patient and husband (dad of the kids).  Both kids also have "their own issues" and are getting help. 2. Make decision on how she wants to handle work situation. 3. Lessen the "what if's" in her thoughts, realizing they are "just thoughts".  Also reviewed strategies that can help her through sharp emotions, as well as the need to not assume the worst in situations that are out in the future and to think of other supports she has instead of relying primarily on her children for support especially on marital issues.  Goal review with patient and some progress noted.  Interventions: Cognitive Behavioral Therapy, Solution-Oriented/Positive Psychology and Ego-Supportive  Diagnosis:   ICD-10-CM   1. Major depressive disorder, recurrent episode, moderate (HCC) F33.1     Plan:  Patient to follow through on practicing strategies for self-care and calming herself, not assuming the negative versus positive, lessening the negative "what if's",  decreasing anxiety, and getting support from others than her children.  Denies SI / HI.  Next appt within 2 wks.  Mathis Fareeborah Aneisa Karren, LCSW

## 2019-01-05 ENCOUNTER — Ambulatory Visit (INDEPENDENT_AMBULATORY_CARE_PROVIDER_SITE_OTHER): Payer: BLUE CROSS/BLUE SHIELD | Admitting: Psychiatry

## 2019-01-05 ENCOUNTER — Other Ambulatory Visit: Payer: Self-pay

## 2019-01-05 DIAGNOSIS — F331 Major depressive disorder, recurrent, moderate: Secondary | ICD-10-CM | POA: Diagnosis not present
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
## 2019-01-10 ENCOUNTER — Other Ambulatory Visit: Payer: Self-pay | Admitting: Physician Assistant

## 2019-03-10 ENCOUNTER — Other Ambulatory Visit: Payer: Self-pay | Admitting: Physician Assistant

## 2019-04-24 ENCOUNTER — Other Ambulatory Visit: Payer: Self-pay

## 2019-04-24 ENCOUNTER — Telehealth: Payer: Self-pay | Admitting: Physician Assistant

## 2019-04-24 MED ORDER — BUPROPION HCL 75 MG PO TABS: ORAL_TABLET | ORAL | 0 refills | Status: DC

## 2019-04-24 NOTE — Telephone Encounter (Signed)
Patient need refill on Bupropion to be sent to [facility] on [address]. Has appt, 05/23/2019

## 2019-04-24 NOTE — Telephone Encounter (Signed)
Refill submitted per request.

## 2019-05-16 ENCOUNTER — Ambulatory Visit: Payer: BLUE CROSS/BLUE SHIELD | Admitting: Physician Assistant

## 2019-05-23 ENCOUNTER — Encounter: Payer: Self-pay | Admitting: Physician Assistant

## 2019-05-23 ENCOUNTER — Other Ambulatory Visit: Payer: Self-pay

## 2019-05-23 ENCOUNTER — Ambulatory Visit (INDEPENDENT_AMBULATORY_CARE_PROVIDER_SITE_OTHER): Payer: BLUE CROSS/BLUE SHIELD | Admitting: Physician Assistant

## 2019-05-23 DIAGNOSIS — G4700 Insomnia, unspecified: Secondary | ICD-10-CM | POA: Diagnosis not present

## 2019-05-23 DIAGNOSIS — F331 Major depressive disorder, recurrent, moderate: Secondary | ICD-10-CM

## 2019-05-23 DIAGNOSIS — Z630 Problems in relationship with spouse or partner: Secondary | ICD-10-CM

## 2019-05-23 MED ORDER — DULOXETINE HCL 60 MG PO CPEP: 60.0000 mg | ORAL_CAPSULE | Freq: Every day | ORAL | 5 refills | Status: DC

## 2019-05-23 MED ORDER — BUPROPION HCL 75 MG PO TABS: 75.0000 mg | ORAL_TABLET | Freq: Every day | ORAL | 5 refills | Status: DC
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
## 2019-06-28 ENCOUNTER — Other Ambulatory Visit: Payer: Self-pay | Admitting: Obstetrics and Gynecology

## 2019-06-28 DIAGNOSIS — Z1231 Encounter for screening mammogram for malignant neoplasm of breast: Secondary | ICD-10-CM

## 2019-07-06 ENCOUNTER — Ambulatory Visit
Admission: RE | Admit: 2019-07-06 | Discharge: 2019-07-06 | Disposition: A | Discharge status: Home / Self Care | Payer: BC Managed Care – PPO | Source: Ambulatory Visit | Attending: Obstetrics and Gynecology | Admitting: Obstetrics and Gynecology

## 2019-07-06 ENCOUNTER — Encounter: Payer: Self-pay | Admitting: Radiology

## 2019-07-06 DIAGNOSIS — Z1231 Encounter for screening mammogram for malignant neoplasm of breast: Secondary | ICD-10-CM | POA: Diagnosis not present

## 2019-11-13 ENCOUNTER — Other Ambulatory Visit: Payer: Self-pay | Admitting: Physician Assistant

## 2019-12-21 ENCOUNTER — Other Ambulatory Visit: Payer: Self-pay | Admitting: Physician Assistant

## 2019-12-27 ENCOUNTER — Telehealth: Payer: Self-pay | Admitting: Physician Assistant

## 2019-12-27 ENCOUNTER — Ambulatory Visit: Payer: BLUE CROSS/BLUE SHIELD | Admitting: Physician Assistant

## 2019-12-27 ENCOUNTER — Other Ambulatory Visit: Payer: Self-pay

## 2019-12-27 MED ORDER — BUPROPION HCL 75 MG PO TABS: 75.0000 mg | ORAL_TABLET | Freq: Every day | ORAL | 0 refills | Status: DC

## 2019-12-27 MED ORDER — DULOXETINE HCL 60 MG PO CPEP: 60.0000 mg | ORAL_CAPSULE | Freq: Every day | ORAL | 0 refills | Status: DC

## 2019-12-27 NOTE — Telephone Encounter (Signed)
Pt appt was rescheduled due to provider being out. Please submit any refills due to the [facility]. Appt scheduled for 6/4

## 2019-12-27 NOTE — Telephone Encounter (Signed)
Error not venlafaxine, she's on Buproprion 75 mg.

## 2019-12-27 NOTE — Telephone Encounter (Signed)
Rx for Duloxetine 60 mg and Venlafaxine 75 mg submitted to [facility]

## 2020-01-05 ENCOUNTER — Telehealth: Payer: Self-pay | Admitting: Physician Assistant

## 2020-01-05 ENCOUNTER — Telehealth (INDEPENDENT_AMBULATORY_CARE_PROVIDER_SITE_OTHER): Payer: BC Managed Care – PPO | Admitting: Physician Assistant

## 2020-01-05 ENCOUNTER — Encounter: Payer: Self-pay | Admitting: Physician Assistant

## 2020-01-05 DIAGNOSIS — G4700 Insomnia, unspecified: Secondary | ICD-10-CM | POA: Diagnosis not present

## 2020-01-05 DIAGNOSIS — Z630 Problems in relationship with spouse or partner: Secondary | ICD-10-CM

## 2020-01-05 DIAGNOSIS — F331 Major depressive disorder, recurrent, moderate: Secondary | ICD-10-CM

## 2020-01-05 MED ORDER — BUPROPION HCL 75 MG PO TABS: 75.0000 mg | ORAL_TABLET | Freq: Every day | ORAL | 5 refills | Status: DC

## 2020-01-05 MED ORDER — DULOXETINE HCL 60 MG PO CPEP: 60.0000 mg | ORAL_CAPSULE | Freq: Every day | ORAL | 5 refills | Status: DC

## 2020-01-05 NOTE — Telephone Encounter (Signed)
Telehealth consent 

## 2020-01-05 NOTE — Progress Notes (Signed)
[facility] Med Check  Patient ID: Wendy Wright,  MRN: [id number]  PCP: Kirk Ruths, MD  Date of Evaluation: 01/05/2020 Time spent:20 minutes   Wendy Wright,you are scheduled for a virtual visit with your provider today.    Just as we do with appointments in the office, we must obtain your consent to participate.  Your consent will be active for this visit and any virtual visit you may have with one of our providers in the next 365 days.    If you have a MyChart account, I can also send a copy of this consent to you electronically.  All virtual visits are billed to your insurance company just like a traditional visit in the office.  As this is a virtual visit, video technology does not allow for your provider to perform a traditional examination.  This may limit your provider's ability to fully assess your condition.  If your provider identifies any concerns that need to be evaluated in person or the need to arrange testing such as labs, EKG, etc, we will make arrangements to do so.    Although advances in technology are sophisticated, we cannot ensure that it will always work on either your end or our end.  If the connection with a video visit is poor, we may have to switch to a telephone visit.  With either a video or telephone visit, we are not always able to ensure that we have a secure connection.   I need to obtain your verbal consent now.   Are you willing to proceed with your visit today?   Wendy Wright has provided verbal consent on 01/05/2020 for a virtual visit (video or telephone).   Donnal Moat, PA-C 01/05/2020  10:09 AM     Chief Complaint:  Chief Complaint    Depression; Follow-up     Virtual Visit via Telephone Note  I connected with patient by a video enabled telemedicine application or telephone, with their informed consent, and verified patient privacy and that I am speaking with the correct person using two identifiers.  I am private, in my office  and the patient is home.  I discussed the limitations, risks, security and privacy concerns of performing an evaluation and management service by telephone/video and the availability of in person appointments. I also discussed with the patient that there may be a patient responsible charge related to this service. The patient expressed understanding and agreed to proceed.   I discussed the assessment and treatment plan with the patient. The patient was provided an opportunity to ask questions and all were answered. The patient agreed with the plan and demonstrated an understanding of the instructions.   The patient was advised to call back or seek an in-person evaluation if the symptoms worsen or if the condition fails to improve as anticipated.  I provided 20  minutes of non-face-to-face time during this encounter.  HISTORY/CURRENT STATUS: HPI For routine med check.  Has had a few times when she accidentally forgot a dose of Wellbutrin or Cymbalta and she got depressed within a few hours.  But when she takes the med on time, not having depression. Feels emotionless and that's the only SE she doesn't like. "That bothers me.  I feel like I'm being really cold."  She does feel like she has more empathy however.  In general, she denies loss of interest in usual activities and is able to enjoy things.  Denies decreased energy or motivation.  Appetite has not  changed.  No extreme sadness, tearfulness, or feelings of hopelessness. Denies suicidal or homicidal thoughts.  Memory issues are still there and she's seen neuro since LOV here. See note on chart and ROS below.    Patient denies increased energy with decreased need for sleep, no increased talkativeness, no racing thoughts, no impulsivity or risky behaviors, no increased spending, no increased libido, no grandiosity.  Denies dizziness, syncope, seizures, numbness, tingling, tremor, tics, unsteady gait, slurred speech, confusion. Denies muscle or  joint pain, stiffness, or dystonia.  Individual Medical History/ Review of Systems: Changes? :Yes saw a Neurologist for memory issues. Started Vitamin D.  Was told to read books and eat healthier and get off carbs, which could help.   Past medications for mental health diagnoses include: Cymbalta, Prozac, Lexapro, Zoloft, Paxil, Effexor, Wellbutrin, Ambien, Melatonin, Buspar, trazodone caused headache, Abilify caused drowsiness.  Allergies: Diclofenac and Trazodone and nefazodone  Current Medications:  Current Outpatient Medications:  .  buPROPion (WELLBUTRIN) 75 MG tablet, Take 1 tablet (75 mg total) by mouth daily., Disp: 30 tablet, Rfl: 5 .  DULoxetine (CYMBALTA) 60 MG capsule, Take 1 capsule (60 mg total) by mouth daily., Disp: 30 capsule, Rfl: 5 .  estrogens, conjugated, (PREMARIN) 0.3 MG tablet, Take by mouth., Disp: , Rfl:  .  fexofenadine (ALLEGRA) 180 MG tablet, Take 180 mg by mouth daily., Disp: , Rfl:  .  omeprazole (PRILOSEC) 20 MG capsule, Take 20 mg by mouth daily., Disp: , Rfl:  Medication Side Effects: none  Family Medical/ Social History: Changes? 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  She and her husband are still married and living together even though patient states they are still having trouble.  MENTAL HEALTH EXAM:  There were no vitals taken for this visit.There is no height or weight on file to calculate BMI.  General Appearance: unable to assess  Eye Contact:  unable to assess  Speech:  Clear and Coherent  Volume:  Normal  Mood:  Euthymic  Affect:  unable to assess  Thought Process:  Goal Directed and Descriptions of Associations: Intact  Orientation:  Full (Time, Place, and Person)  Thought Content: Logical   Suicidal Thoughts:  No  Homicidal Thoughts:  No  Memory:  Recent;   Fair o/w nl  Judgement:  Good  Insight:  Good  Psychomotor Activity:  unable to assess  Concentration:  Concentration: Fair and Attention Span: Fair  Recall:  Good   Fund of Knowledge: Good  Language: Good  Assets:  Desire for Improvement  ADL's:  Intact  Cognition: WNL  Prognosis:  Good    DIAGNOSES:    ICD-10-CM   1. Major depressive disorder, recurrent episode, moderate (HCC)  F33.1   2. Insomnia, unspecified type  G47.00   3. Marital stress  Z63.0     Receiving Psychotherapy: Yes I recommend she get back in with Rockne Menghini, LCSW  RECOMMENDATIONS:  PDMP was reviewed. I spent 20 minutes with her. Continue Wellbutrin 75 mg every morning. Continue Cymbalta 60 mg daily. Encouraged her to get back into counseling. Return in 6 months.  [name], PA-C

## 2020-07-31 ENCOUNTER — Telehealth: Payer: Self-pay | Admitting: Physician Assistant

## 2020-08-06 NOTE — Telephone Encounter (Signed)
Error

## 2020-08-07 ENCOUNTER — Ambulatory Visit (INDEPENDENT_AMBULATORY_CARE_PROVIDER_SITE_OTHER): Payer: BC Managed Care – PPO | Admitting: Physician Assistant

## 2020-08-07 ENCOUNTER — Encounter: Payer: Self-pay | Admitting: Physician Assistant

## 2020-08-07 ENCOUNTER — Other Ambulatory Visit: Payer: Self-pay

## 2020-08-07 DIAGNOSIS — F32A Depression, unspecified: Secondary | ICD-10-CM | POA: Diagnosis not present

## 2020-08-07 DIAGNOSIS — G4700 Insomnia, unspecified: Secondary | ICD-10-CM | POA: Diagnosis not present

## 2020-08-07 DIAGNOSIS — E6609 Other obesity due to excess calories: Secondary | ICD-10-CM

## 2020-08-07 MED ORDER — BUPROPION HCL 100 MG PO TABS: 100.0000 mg | ORAL_TABLET | Freq: Every day | ORAL | 1 refills | Status: DC

## 2020-08-07 MED ORDER — DULOXETINE HCL 60 MG PO CPEP: 60.0000 mg | ORAL_CAPSULE | Freq: Every day | ORAL | 1 refills | Status: DC

## 2020-08-07 NOTE — Progress Notes (Signed)
[facility] Med Check  Patient ID: Wendy Wright,  MRN: [id number]  PCP: Lauro Regulus, MD  Date of Evaluation: 08/07/2020 Time spent:30 minutes    Chief Complaint:  Chief Complaint    Anxiety; Depression; Follow-up      HISTORY/CURRENT STATUS: For routine med check.  States she ran out of Cymbalta on Christmas Eve, reports she called our office but 'the message said y'all wouldn't call in RF so I didn't even bother leaving a msg with the answering service.' York Spaniel she 'went through withdrawals' just didn't feel right. No fever, chills, n/v/c/d, seizures.   Before she ran out of that, she 'still had depression.' But is able to enjoy things, energy and motivation are ok. Doesn't miss work due to depression, at least right now, but sometimes in the past she has when the weather is cold or weather fronts come through she isn't able to go to work because of depression.  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 Snacks a lot but no more than usual. Does tend to crave sugar. Weight is stable.  Does not cry easily.  Not isolating.  Sleeps good now. No SI/HI.   She will have anxiety at times with certain triggers.  She is usually able to control those, mostly by avoiding known triggers then she will not she does feel on edge when she is really tired but overall not reporting a lot of anxiety.  Her memory is bad.  Has seen neurologist in the past for evaluation of that.  No changes were made in medications.  Patient denies increased energy with decreased need for sleep, no increased talkativeness, no racing thoughts, no impulsivity or risky behaviors, no increased spending, no increased libido, no grandiosity, no paranoia or hallucinations.  Denies dizziness, syncope, seizures, numbness, tingling, tremor, tics, unsteady gait, slurred speech, confusion. Denies muscle or joint pain, stiffness, or dystonia.  Individual Medical History/ Review of  Systems: Changes? :No     Past medications for mental health diagnoses include: Cymbalta, Prozac, Lexapro, Zoloft, Paxil, Effexor, Wellbutrin, Ambien, Melatonin, Buspar, trazodone caused headache, Abilify caused drowsiness.  Allergies: Diclofenac and Trazodone and nefazodone  Current Medications:  Current Outpatient Medications:  .  buPROPion (WELLBUTRIN) 100 MG tablet, Take 1 tablet (100 mg total) by mouth daily with breakfast., Disp: 30 tablet, Rfl: 1 .  fexofenadine (ALLEGRA) 180 MG tablet, Take 180 mg by mouth daily., Disp: , Rfl:  .  omeprazole (PRILOSEC) 20 MG capsule, Take 20 mg by mouth daily., Disp: , Rfl:  .  DULoxetine (CYMBALTA) 60 MG capsule, Take 1 capsule (60 mg total) by mouth daily., Disp: 90 capsule, Rfl: 1 .  estrogens, conjugated, (PREMARIN) 0.3 MG tablet, Take by mouth., Disp: , Rfl:  Medication Side Effects: none  Family Medical/ Social History: Changes? No  MENTAL HEALTH EXAM:  There were no vitals taken for this visit.There is no height or weight on file to calculate BMI.  General Appearance: Casual, Neat, Well Groomed and Obese  Eye Contact:  Good  Speech:  Clear and Coherent  Volume:  Normal  Mood:  Euthymic  Affect:  Appropriate  Thought Process:  Goal Directed and Descriptions of Associations: Intact  Orientation:  Full (Time, Place, and Person)  Thought Content: Logical   Suicidal Thoughts:  No  Homicidal Thoughts:  No  Memory:  Recent;   Fair o/w nl  Judgement:  Good  Insight:  Good  Psychomotor Activity:  Normal  Concentration:  Concentration: Fair and Attention Span: Fair  Recall:  Good  Fund of Knowledge: Good  Language: Good  Assets:  Desire for Improvement  ADL's:  Intact  Cognition: WNL  Prognosis:  Good    DIAGNOSES:    ICD-10-CM   1. Melancholy  F32.A   2. Class 2 obesity due to excess calories without serious comorbidity in adult, unspecified BMI  E66.09   3. Insomnia, unspecified type  G47.00     Receiving Psychotherapy: No  I recommend she get back in with Rockne Menghini, LCSW  RECOMMENDATIONS:  PDMP was reviewed. I provided 30 minutes of face-to-face time during this encounter, in which we discussed the depression.  She seems to be more melancholy, worse with winter months.  We have had the discussion in the past as to whether she has been on a higher dose of Cymbalta or Wellbutrin.  She remembers 1 or the other being increased but is not sure which one it was.  All she remembers is that she did not feel well with it but cannot remember specifics.  Of the decreased energy and motivation I recommend increasing the Wellbutrin.  We will bump it up very slowly to make sure she does not have any side effects such as increased anxiety, irritability, or anger.  She would like to try this.  "I will do to feel better." Increase Wellbutrin to 100 mg, 1 p.o. every morning. Continue Cymbalta 60 mg, 1 p.o. daily. Recommend counseling. Return in 4 to 6 weeks.  [name], PA-C

## 2020-09-23 ENCOUNTER — Ambulatory Visit (INDEPENDENT_AMBULATORY_CARE_PROVIDER_SITE_OTHER): Payer: BC Managed Care – PPO | Admitting: Physician Assistant

## 2020-09-23 ENCOUNTER — Encounter: Payer: Self-pay | Admitting: Physician Assistant

## 2020-09-23 ENCOUNTER — Other Ambulatory Visit: Payer: Self-pay

## 2020-09-23 DIAGNOSIS — F331 Major depressive disorder, recurrent, moderate: Secondary | ICD-10-CM

## 2020-09-23 DIAGNOSIS — G4700 Insomnia, unspecified: Secondary | ICD-10-CM | POA: Diagnosis not present

## 2020-09-23 NOTE — Progress Notes (Signed)
[facility]  Patient ID: Wendy Wright,  MRN: [id number]  PCP: Lauro Regulus, MD  Date of Evaluation: 09/23/2020 Time spent:20 minutes    Chief Complaint:  Chief Complaint    Anxiety; Depression; Follow-up      HISTORY/CURRENT STATUS: For routine med check.  15 minutes late for appointment.  Adding the Wellbutrin helped the first week or so, but then she got covid and energy went back down. She works 3 jobs and is very tired a lot. 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 Sleeps ok most of the time. Not crying easily. Does have some anxiety triggered by upcoming move, downsizing. Stressed b/c her daughter is moving to [place] but she doesn't know what she's going to do. Not isolating. No SI/HI.   Patient denies increased energy with decreased need for sleep, no increased talkativeness, no racing thoughts, no impulsivity or risky behaviors, no increased spending, no increased libido, no grandiosity, no paranoia, or hallucinations.  Denies dizziness, syncope, seizures, numbness, tingling, tremor, tics, unsteady gait, slurred speech, confusion. Denies muscle or joint pain, stiffness, or dystonia.  Individual Medical History/ Review of Systems: Changes? :Yes  had covid last month.   Past medications for mental health diagnoses include: Cymbalta, Prozac, Lexapro, Zoloft, Paxil, Effexor, Wellbutrin, Ambien, Melatonin, Buspar, trazodone caused headache, Abilify caused drowsiness.  Allergies: Diclofenac and Trazodone and nefazodone  Current Medications:  Current Outpatient Medications:  .  buPROPion (WELLBUTRIN) 100 MG tablet, Take 1 tablet (100 mg total) by mouth daily with breakfast., Disp: 30 tablet, Rfl: 1 .  doxepin (SINEQUAN) 10 MG capsule, Take by mouth., Disp: , Rfl:  .  DULoxetine (CYMBALTA) 60 MG capsule, Take 1 capsule (60 mg total) by mouth daily., Disp: 90 capsule, Rfl: 1 .  fexofenadine (ALLEGRA)  180 MG tablet, Take 180 mg by mouth daily., Disp: , Rfl:  .  omeprazole (PRILOSEC) 20 MG capsule, Take 20 mg by mouth daily., Disp: , Rfl:  .  estrogens, conjugated, (PREMARIN) 0.3 MG tablet, Take by mouth., Disp: , Rfl:  Medication Side Effects: none  Family Medical/ Social History: Changes? No  MENTAL HEALTH EXAM:  There were no vitals taken for this visit.There is no height or weight on file to calculate BMI.  General Appearance: Casual, Neat, Well Groomed and Obese  Eye Contact:  Good  Speech:  Clear and Coherent  Volume:  Normal  Mood:  Euthymic  Affect:  Appropriate  Thought Process:  Goal Directed and Descriptions of Associations: Intact  Orientation:  Full (Time, Place, and Person)  Thought Content: Logical   Suicidal Thoughts:  No  Homicidal Thoughts:  No  Memory:  Recent;   Fair   Judgement:  Good  Insight:  Good  Psychomotor Activity:  Normal  Concentration:  Concentration: Good and Attention Span: Good  Recall:  Good  Fund of Knowledge: Good  Language: Good  Assets:  Desire for Improvement  ADL's:  Intact  Cognition: WNL  Prognosis:  Good    DIAGNOSES:    ICD-10-CM   1. Major depressive disorder, recurrent episode, moderate (HCC)  F33.1   2. Insomnia, unspecified type  G47.00     Receiving Psychotherapy: No I recommend she get back in with Rockne Menghini, LCSW (states she knows she needs to do the work herself at home and if she is not going to do that there is no reason to go to counseling.)  RECOMMENDATIONS:  PDMP was  reviewed. I provided 20 minutes of face-to-face time during this encounter, in which we discussed different options.  I believe she did respond to the Wellbutrin but dose is still low and recommend increase. Increase Wellbutrin 150 mg (she has 75 mg and will take 2 qd)  Send in XL 150mg  if effective  Continue Cymbalta 60 mg, 1 p.o. daily. Return in 6 weeks.  , PA-C

## 2020-10-03 ENCOUNTER — Other Ambulatory Visit: Payer: Self-pay | Admitting: Obstetrics and Gynecology

## 2020-10-03 DIAGNOSIS — Z1231 Encounter for screening mammogram for malignant neoplasm of breast: Secondary | ICD-10-CM

## 2020-10-21 ENCOUNTER — Other Ambulatory Visit: Payer: Self-pay

## 2020-10-21 ENCOUNTER — Telehealth: Payer: Self-pay | Admitting: Physician Assistant

## 2020-10-21 MED ORDER — BUPROPION HCL ER (XL) 150 MG PO TB24: 150.0000 mg | ORAL_TABLET | Freq: Every day | ORAL | 0 refills | Status: DC

## 2020-10-21 NOTE — Telephone Encounter (Signed)
Rx for Wellbutrin XL 150 mg 1/daily to [facility]

## 2020-10-21 NOTE — Telephone Encounter (Signed)
Please review. A bit confusing. I left the 100 mg Wellbutrin on her med list just to review.

## 2020-10-21 NOTE — Telephone Encounter (Signed)
Wendy Wright called in today stating that she needs a refill for Wellbutrin 100mg . Says she only has one pill left and would like her prescription filled ASAP as she doesn't want to have a bad day. Appt 4/19. Pls CB if more information is needed. [phone]. Pharmacy [facility] [address] [place], [place]

## 2020-10-21 NOTE — Telephone Encounter (Signed)
Tried to reach pt to clarify her dose. Pt should be taking 150 mg XL Wellbutrin but message request 100 mg Wellbutrin. Tried reaching [employer] and # kept ringing.

## 2020-10-21 NOTE — Telephone Encounter (Signed)
Wendy Wright the note says : Increase Wellbutrin 150 mg (she has 75 mg and will take 2 qd)  Send in XL 150mg  if effective   I was going to send refill but I believe she will need a new Rx sent for the XL and I still don't know how to send a new Rx

## 2020-10-22 ENCOUNTER — Other Ambulatory Visit: Payer: Self-pay | Admitting: Physician Assistant

## 2020-10-22 NOTE — Telephone Encounter (Signed)
Yes it should be Wellbutrin XL 150 mg.  I discontinued the 100 mg on the chart.

## 2020-10-23 ENCOUNTER — Ambulatory Visit
Admission: RE | Admit: 2020-10-23 | Discharge: 2020-10-23 | Disposition: A | Discharge status: Home / Self Care | Payer: BC Managed Care – PPO | Source: Ambulatory Visit | Attending: Obstetrics and Gynecology | Admitting: Obstetrics and Gynecology

## 2020-10-23 ENCOUNTER — Other Ambulatory Visit: Payer: Self-pay

## 2020-10-23 DIAGNOSIS — Z1231 Encounter for screening mammogram for malignant neoplasm of breast: Secondary | ICD-10-CM | POA: Diagnosis present

## 2020-10-23 NOTE — Telephone Encounter (Signed)
Reviewed

## 2020-11-05 ENCOUNTER — Telehealth: Payer: Self-pay | Admitting: Physician Assistant

## 2020-11-05 NOTE — Telephone Encounter (Signed)
See if she is ok with increasing the Cymbalta.  That might be a better choice and we can wean off the wellbutrin.

## 2020-11-05 NOTE — Telephone Encounter (Signed)
Spoke with Wendy Wright concerning her Bupropion XL. She has concerns about it's effectiveness. She stated she is not consistent with the time she is taking it, and if she deviate as little as a 30 minute window she can tell a difference. Wendy Wright states this is creating increased depression. Please advise. # [id number].

## 2020-11-05 NOTE — Telephone Encounter (Signed)
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

## 2020-11-06 NOTE — Telephone Encounter (Signed)
Pt stated in the past when she has tried to increase Cymbalta it caused her to be more irritable/ angry and did not help her.She mentioned she is also taking estrogen and wants to know can increasing/decreasing help with anything.

## 2020-11-07 ENCOUNTER — Other Ambulatory Visit: Payer: Self-pay | Admitting: Physician Assistant

## 2020-11-07 MED ORDER — HYDROXYZINE HCL 10 MG PO TABS: 10.0000 mg | ORAL_TABLET | Freq: Three times a day (TID) | ORAL | 0 refills | Status: DC | PRN

## 2020-11-07 NOTE — Telephone Encounter (Signed)
Prescription was sent

## 2020-11-07 NOTE — Telephone Encounter (Signed)
She stated she is willing to try one of those medications but she is not familiar with them so she said to send which ever one you think is best for her.[facility] pharmacy on file

## 2020-11-07 NOTE — Telephone Encounter (Signed)
Have her wean off the Wellbutrin, by taking 1/2 qd for 1 week, then stop.  Continue current dose of Cymbalta. She'll have to discuss the estrogen issue with her GYN, I'm not sure about that.  Some of her symptoms are due to the stress she is under.  I think a rescue medication such as hydroxyzine or even Xanax or Ativan would help when she is super anxious.  Let me know if she is open to using 1 of those and I can send it in. Thank you

## 2020-11-14 ENCOUNTER — Other Ambulatory Visit: Payer: Self-pay | Admitting: Physician Assistant

## 2020-11-14 ENCOUNTER — Telehealth: Payer: Self-pay | Admitting: Physician Assistant

## 2020-11-14 MED ORDER — BUPROPION HCL ER (XL) 150 MG PO TB24: 150.0000 mg | ORAL_TABLET | Freq: Every day | ORAL | 0 refills | Status: DC

## 2020-11-14 NOTE — Telephone Encounter (Signed)
Prescription was sent

## 2020-11-14 NOTE — Telephone Encounter (Signed)
Next visit is 11/19/20. Wendy Wright hasn't weaned off her Wellbutrin yet because she states she is scared to wean off of it. She is requesting to get a refill of Wellbutrin to get her through until her next appt with you on 11/19/20. She wants to discuss the withdrawal with you on her next visit. Pharmacy is:  [facility] [address], [place] - [address] Phone:  [phone]  Fax:  [phone]

## 2020-11-19 ENCOUNTER — Encounter: Payer: Self-pay | Admitting: Physician Assistant

## 2020-11-19 ENCOUNTER — Ambulatory Visit (INDEPENDENT_AMBULATORY_CARE_PROVIDER_SITE_OTHER): Payer: BC Managed Care – PPO | Admitting: Physician Assistant

## 2020-11-19 ENCOUNTER — Other Ambulatory Visit: Payer: Self-pay

## 2020-11-19 DIAGNOSIS — G4700 Insomnia, unspecified: Secondary | ICD-10-CM

## 2020-11-19 DIAGNOSIS — F331 Major depressive disorder, recurrent, moderate: Secondary | ICD-10-CM | POA: Diagnosis not present

## 2020-11-19 DIAGNOSIS — F32A Depression, unspecified: Secondary | ICD-10-CM | POA: Diagnosis not present

## 2020-11-19 MED ORDER — MODAFINIL 200 MG PO TABS: 100.0000 mg | ORAL_TABLET | Freq: Every morning | ORAL | 0 refills | Status: DC

## 2020-11-19 NOTE — Progress Notes (Signed)
[facility]  Patient ID: Wendy Wright,  MRN: [id number]  PCP: Lauro Regulus, MD  Date of Evaluation: 11/19/2020 Time spent:30 minutes  Chief Complaint:  Chief Complaint    Anxiety; Depression; Follow-up      HISTORY/CURRENT STATUS: HPI For routine med check.  If she misses her med (thinks it's probably the Wellbutrin) she gets dizzy and feels more depressed.  Even if it is only about 30 minutes.  Has felt it all along, and hasn't had any good effects of appetite suppressant, increased energy, or increased focus.   Still feels sad. But not daily. She does have passive SI off and on, but states she won't do anything b/c she cares about her family. Stress with husband, who lashes out at her a lot, verbally not physically. We've increased and decreased the cymbalta and it's either made her too agitated or more depressed. Appetite is increased but not really gaining weight after eating a lot of sweets. Work is fine. She and husband work together some of the time.  She works 3 jobs and is tired a lot.  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  Sleeps fine.  No homicidal thoughts.  Passive SI as noted above.  Does not have any energy or motivation.  It does not matter if there is something she enjoys or not, she still prefers to stay home.  Patient denies increased energy with decreased need for sleep, no increased talkativeness, no racing thoughts, no impulsivity or risky behaviors, no increased spending, no increased libido, no grandiosity, no increased irritability or anger, and no hallucinations.  Anxiety can still be a problem.  She has taken the hydroxyzine a couple of times.  It was very sedating so she did not continue it.  Not having panic attacks but just a generalized sense of unease.  Denies dizziness, syncope, seizures, numbness, tingling, tremor, tics, unsteady gait, slurred speech,  confusion. Denies muscle or joint pain, stiffness, or dystonia.  Individual Medical History/ Review of Systems: Changes? :No   Past medications for mental health diagnoses include: Cymbalta, Prozac, Lexapro, Zoloft, Paxil, Effexor, Wellbutrin, Ambien, Melatonin, Buspar, trazodone caused headache, Abilify caused drowsiness.  Allergies: Diclofenac and Trazodone and nefazodone  Current Medications:  Current Outpatient Medications:  .  buPROPion (WELLBUTRIN XL) 150 MG 24 hr tablet, Take 1 tablet (150 mg total) by mouth daily., Disp: 30 tablet, Rfl: 0 .  doxepin (SINEQUAN) 10 MG capsule, Take by mouth., Disp: , Rfl:  .  DULoxetine (CYMBALTA) 60 MG capsule, Take 1 capsule (60 mg total) by mouth daily., Disp: 90 capsule, Rfl: 1 .  fexofenadine (ALLEGRA) 180 MG tablet, Take 180 mg by mouth daily., Disp: , Rfl:  .  hydrOXYzine (ATARAX/VISTARIL) 10 MG tablet, Take 1-2 tablets (10-20 mg total) by mouth 3 (three) times daily as needed for anxiety., Disp: 60 tablet, Rfl: 0 .  modafinil (PROVIGIL) 200 MG tablet, Take 0.5 tablets (100 mg total) by mouth in the morning., Disp: 30 tablet, Rfl: 0 .  omeprazole (PRILOSEC) 20 MG capsule, Take 20 mg by mouth daily., Disp: , Rfl:  .  estrogens, conjugated, (PREMARIN) 0.3 MG tablet, Take by mouth., Disp: , Rfl:  Medication Side Effects: none  Family Medical/ Social History: Changes? No  MENTAL HEALTH EXAM:  There were no vitals taken for this visit.There is no height or weight on file to calculate BMI.  General Appearance: Casual and  Fairly Groomed  Eye Contact:  Good  Speech:  Clear and Coherent and Normal Rate  Volume:  Normal  Mood:  Depressed  Affect:  Depressed  Thought Process:  Goal Directed and Descriptions of Associations: Circumstantial  Orientation:  Full (Time, Place, and Person)  Thought Content: Logical   Suicidal Thoughts:  Yes.  without intent/plan  Homicidal Thoughts:  No  Memory:  WNL  Judgement:  Good  Insight:  Good  Psychomotor  Activity:  Normal  Concentration:  Concentration: Good  Recall:  Good  Fund of Knowledge: Good  Language: Good  Assets:  Desire for Improvement  ADL's:  Intact  Cognition: WNL  Prognosis:  Good    DIAGNOSES:    ICD-10-CM   1. Major depressive disorder, recurrent episode, moderate (HCC)  F33.1   2. Melancholy  F32.A   3. Insomnia, unspecified type  G47.00     Receiving Psychotherapy: No    RECOMMENDATIONS:  PDMP was reviewed. I provided 30 minutes of face-to-face time during this encounter, including time spent before and after the visit in records review and charting. We discussed the different options for the melancholy mood that she is often and, and especially no energy or motivation to do things.  I was hoping the Wellbutrin would be beneficial but it has not.  I recommend trying modafinil or armodafinil.  She understands this medication is FDA approved for narcolepsy only and can be expensive at some pharmacies.  Benefits, risks, and side effects were discussed and she would like to try it.  [facility] is the [facility] expensive [facility] on good Rx so I am sending the prescription there. For now we will continue the Wellbutrin but we will probably discontinue that.  I do not want to do too many things at once.  Trintellix, Viibryd, or Rexulti may be beneficial and we will discuss those options if adding the modafinil is not helpful. Continue Wellbutrin XL 150 mg, 1 p.o. every morning. Continue doxepin 10 mg, 1 p.o. nightly. Continue Cymbalta 60 mg, 1 p.o. daily. Continue hydroxyzine 10 mg, 1-2 p.o. 3 times daily as needed. Start modafinil 200 mg, one half p.o. every morning. Contract for safety is in place.  She knows to go to the emergency room or [facility] if suicidal thoughts worsen and she has a plan to commit suicide. Recommend getting back into counseling. Return in 6 to 8 weeks.  [name], PA-C

## 2020-11-20 DIAGNOSIS — F331 Major depressive disorder, recurrent, moderate: Secondary | ICD-10-CM | POA: Insufficient documentation

## 2020-11-20 DIAGNOSIS — G4700 Insomnia, unspecified: Secondary | ICD-10-CM | POA: Insufficient documentation

## 2020-11-20 DIAGNOSIS — F32A Depression, unspecified: Secondary | ICD-10-CM | POA: Insufficient documentation

## 2021-01-08 ENCOUNTER — Telehealth (INDEPENDENT_AMBULATORY_CARE_PROVIDER_SITE_OTHER): Payer: BC Managed Care – PPO | Admitting: Physician Assistant

## 2021-01-08 ENCOUNTER — Encounter: Payer: Self-pay | Admitting: Physician Assistant

## 2021-01-08 DIAGNOSIS — F32A Depression, unspecified: Secondary | ICD-10-CM

## 2021-01-08 DIAGNOSIS — G4700 Insomnia, unspecified: Secondary | ICD-10-CM

## 2021-01-08 MED ORDER — DULOXETINE HCL 60 MG PO CPEP: 60.0000 mg | ORAL_CAPSULE | Freq: Every day | ORAL | 1 refills | Status: DC

## 2021-01-08 NOTE — Progress Notes (Signed)
[facility]  Patient ID: Wendy Wright,  MRN: [id number]  PCP: Lauro Regulus, MD  Date of Evaluation: 01/08/2021 Time spent:20 minutes  Chief Complaint:  Chief Complaint    Depression; Insomnia; Follow-up      Virtual Visit via Telehealth  I connected with patient by telephone, with their informed consent, and verified patient privacy and that I am speaking with the correct person using two identifiers.  I am private, in my office and the patient is at home.  I discussed the limitations, risks, security and privacy concerns of performing an evaluation and management service by telephone and the availability of in person appointments. I also discussed with the patient that there may be a patient responsible charge related to this service. The patient expressed understanding and agreed to proceed.   I discussed the assessment and treatment plan with the patient. The patient was provided an opportunity to ask questions and all were answered. The patient agreed with the plan and demonstrated an understanding of the instructions.   The patient was advised to call back or seek an in-person evaluation if the symptoms worsen or if the condition fails to improve as anticipated.  I provided 20 minutes of non-face-to-face time during this encounter.   HISTORY/CURRENT STATUS: HPI For routine med check.  At LOV, we added Modafinil. It may have been the cause of muscle cramps. It wasn't helping much with memory/alertness, but then she states it may be helping a little bit.  She is only been taking it as needed.  Patient denies loss of interest in usual activities and is able to enjoy things.  Denies decreased energy or motivation.  Appetite has not changed.  No extreme sadness, tearfulness, or feelings of hopelessness. Denies suicidal or homicidal thoughts.  Patient denies increased energy with decreased need for sleep, no increased talkativeness, no racing thoughts, no  impulsivity or risky behaviors, no increased spending, no increased libido, no grandiosity, no increased irritability or anger, and no hallucinations.  Denies dizziness, syncope, seizures, numbness, tingling, tremor, tics, unsteady gait, slurred speech, confusion. Denies muscle or joint pain, stiffness, or dystonia.  Individual Medical History/ Review of Systems: Changes? :No   Past medications for mental health diagnoses include: Cymbalta, Prozac, Lexapro, Zoloft, Paxil, Effexor, Wellbutrin, Ambien, Melatonin, Buspar, trazodone caused headache, Abilify caused drowsiness.  Allergies: Diclofenac and Trazodone and nefazodone  Current Medications:  Current Outpatient Medications:  .  doxepin (SINEQUAN) 10 MG capsule, Take by mouth., Disp: , Rfl:  .  fexofenadine (ALLEGRA) 180 MG tablet, Take 180 mg by mouth daily., Disp: , Rfl:  .  hydrOXYzine (ATARAX/VISTARIL) 10 MG tablet, Take 1-2 tablets (10-20 mg total) by mouth 3 (three) times daily as needed for anxiety., Disp: 60 tablet, Rfl: 0 .  modafinil (PROVIGIL) 200 MG tablet, Take 0.5 tablets (100 mg total) by mouth in the morning., Disp: 30 tablet, Rfl: 0 .  omeprazole (PRILOSEC) 20 MG capsule, Take 20 mg by mouth daily., Disp: , Rfl:  .  DULoxetine (CYMBALTA) 60 MG capsule, Take 1 capsule (60 mg total) by mouth daily., Disp: 90 capsule, Rfl: 1 .  estrogens, conjugated, (PREMARIN) 0.3 MG tablet, Take by mouth., Disp: , Rfl:  Medication Side Effects: Muscle cramps  Family Medical/ Social History: Changes? No  MENTAL HEALTH EXAM:  There were no vitals taken for this visit.There is no height or weight on file to calculate BMI.  General Appearance: Unable to assess  Eye Contact:  Unable to assess  Speech:  Clear and Coherent and Normal Rate  Volume:  Normal  Mood:  Euthymic  Affect:  Unable to assess  Thought Process:  Goal Directed and Descriptions of Associations: Circumstantial  Orientation:  Full (Time, Place, and Person)  Thought  Content: Logical   Suicidal Thoughts:  No  Homicidal Thoughts:  No  Memory:  Immediate;   Fair Recent;   Fair  Judgement:  Good  Insight:  Good  Psychomotor Activity:  Unable to assess  Concentration:  Concentration: Fair and Attention Span: Fair  Recall:  Good  Fund of Knowledge: Good  Language: Good  Assets:  Desire for Improvement  ADL's:  Intact  Cognition: WNL  Prognosis:  Good    DIAGNOSES:    ICD-10-CM   1. Melancholy  F32.A   2. Insomnia, unspecified type  G47.00     Receiving Psychotherapy: No    RECOMMENDATIONS:  PDMP reviewed. I provided 20 mins of nonface-to-face time during this encounter, including time spent before and after the visit and records review, medical decision making, and charting. States she is doing well overall and feels like the decreased memory and energy is something she will just need to learn to live with.  She prefers not to add on anything else or increase the modafinil because of the possible side effects.  If her symptoms worsen at any point she can let me know and we will decide at that point what we should do.  We did discuss different vitamins and supplements that may be helpful. Recommend multivitamin, B complex, ginkgo biloba, vitamin D. Cont Cymbalta 60 mg, 1 p.o. daily. Cont Modafinil 200 mg, 1/2 po q am.  As needed. Continue doxepin 10 mg, 1 p.o. nightly as needed. Return in 6 months.  [name], PA-C

## 2021-01-13 ENCOUNTER — Other Ambulatory Visit: Payer: Self-pay | Admitting: Physician Assistant

## 2021-01-14 NOTE — Telephone Encounter (Signed)
Last filled 4/22

## 2021-01-27 ENCOUNTER — Ambulatory Visit: Payer: BC Managed Care – PPO | Admitting: Physician Assistant

## 2021-07-03 ENCOUNTER — Other Ambulatory Visit: Payer: Self-pay | Admitting: Physician Assistant

## 2021-08-11 ENCOUNTER — Other Ambulatory Visit: Payer: Self-pay

## 2021-08-11 ENCOUNTER — Telehealth: Payer: Self-pay | Admitting: Physician Assistant

## 2021-08-11 MED ORDER — DULOXETINE HCL 60 MG PO CPEP: 60.0000 mg | ORAL_CAPSULE | Freq: Every day | ORAL | 0 refills | Status: DC

## 2021-08-11 NOTE — Telephone Encounter (Signed)
Rx sent 

## 2021-08-11 NOTE — Telephone Encounter (Signed)
Pt would like refill of Duloxetine sent to  [facility]  [address] [place], [place]  [address].   Next appt 1/16

## 2021-08-18 ENCOUNTER — Ambulatory Visit (INDEPENDENT_AMBULATORY_CARE_PROVIDER_SITE_OTHER): Payer: Self-pay | Admitting: Physician Assistant

## 2021-08-18 ENCOUNTER — Encounter: Payer: Self-pay | Admitting: Physician Assistant

## 2021-08-18 DIAGNOSIS — Z6379 Other stressful life events affecting family and household: Secondary | ICD-10-CM

## 2021-08-18 DIAGNOSIS — F32A Depression, unspecified: Secondary | ICD-10-CM | POA: Diagnosis not present

## 2021-08-18 DIAGNOSIS — F3341 Major depressive disorder, recurrent, in partial remission: Secondary | ICD-10-CM | POA: Diagnosis not present

## 2021-08-18 MED ORDER — DULOXETINE HCL 60 MG PO CPEP: 60.0000 mg | ORAL_CAPSULE | Freq: Every day | ORAL | 1 refills | Status: DC

## 2021-08-18 MED ORDER — MODAFINIL 200 MG PO TABS: ORAL_TABLET | ORAL | 2 refills | Status: DC

## 2021-08-18 NOTE — Progress Notes (Signed)
[facility]  Patient ID: Wendy Wright,  MRN: [id number]  PCP: Lauro Regulus, MD  Date of Evaluation: 08/18/2021 Time spent:30 minutes  Chief Complaint:  Chief Complaint   Depression; Follow-up    Virtual Visit via Telehealth  I connected with patient by telephone, with their informed consent, and verified patient privacy and that I am speaking with the correct person using two identifiers.  I am private, in my office and the patient is at her mom's home.  I discussed the limitations, risks, security and privacy concerns of performing an evaluation and management service by telephone and the availability of in person appointments. I also discussed with the patient that there may be a patient responsible charge related to this service. The patient expressed understanding and agreed to proceed.   I discussed the assessment and treatment plan with the patient. The patient was provided an opportunity to ask questions and all were answered. The patient agreed with the plan and demonstrated an understanding of the instructions.   The patient was advised to call back or seek an in-person evaluation if the symptoms worsen or if the condition fails to improve as anticipated.  I provided 30 minutes of non-face-to-face time during this encounter.   HISTORY/CURRENT STATUS: HPI For routine med check.  Takes the Modafinil when needed to keep her awake, and it does help. She takes 50 mg, that is enough.   Patient denies loss of interest in usual activities and is able to enjoy things.  Denies decreased energy or motivation.  Appetite has not changed.  No extreme sadness, tearfulness, or feelings of hopelessness.  Denies any changes in concentration, making decisions or remembering things. Sleeps okay.  Not having a lot of anxiety.  Does not take the hydroxyzine.  Denies suicidal or homicidal thoughts.  Patient denies increased energy with decreased need for sleep, no  increased talkativeness, no racing thoughts, no impulsivity or risky behaviors, no increased spending, no increased libido, no grandiosity, no increased irritability or anger, and no hallucinations.  Denies dizziness, syncope, seizures, numbness, tingling, tremor, tics, unsteady gait, slurred speech, confusion. Denies muscle or joint pain, stiffness, or dystonia.  Individual Medical History/ Review of Systems: Changes? :No   Past medications for mental health diagnoses include: Cymbalta, Prozac, Lexapro, Zoloft, Paxil, Effexor, Wellbutrin, Ambien, Melatonin, Buspar, trazodone caused headache, Abilify caused drowsiness.  Allergies: Diclofenac and Trazodone and nefazodone  Current Medications:  Current Outpatient Medications:    fexofenadine (ALLEGRA) 180 MG tablet, Take 180 mg by mouth daily., Disp: , Rfl:    hydrOXYzine (ATARAX/VISTARIL) 10 MG tablet, Take 1-2 tablets (10-20 mg total) by mouth 3 (three) times daily as needed for anxiety., Disp: 60 tablet, Rfl: 0   omeprazole (PRILOSEC) 20 MG capsule, Take 20 mg by mouth daily., Disp: , Rfl:    doxepin (SINEQUAN) 10 MG capsule, Take by mouth., Disp: , Rfl:    DULoxetine (CYMBALTA) 60 MG capsule, Take 1 capsule (60 mg total) by mouth daily., Disp: 90 capsule, Rfl: 1   estrogens, conjugated, (PREMARIN) 0.3 MG tablet, Take by mouth., Disp: , Rfl:    modafinil (PROVIGIL) 200 MG tablet, TAKE 1/2 (ONE-HALF) TABLET BY MOUTH IN THE MORNING, Disp: 30 tablet, Rfl: 2 Medication Side Effects: none  Family Medical/ Social History: Changes?  Her mother currently has cancer, patient is staying with her to help her out right now.  MENTAL HEALTH EXAM:  There were no vitals taken for this visit.There is no height or weight on file  to calculate BMI.  General Appearance:  Unable to assess  Eye Contact:   Unable to assess  Speech:  Clear and Coherent and Normal Rate  Volume:  Normal  Mood:  Euthymic  Affect:   Unable to assess  Thought Process:  Goal  Directed and Descriptions of Associations: Circumstantial  Orientation:  Full (Time, Place, and Person)  Thought Content: Logical   Suicidal Thoughts:  No  Homicidal Thoughts:  No  Memory:  Immediate;   Fair Recent;   Fair  Judgement:  Good  Insight:  Good  Psychomotor Activity:   Unable to assess  Concentration:  Concentration: Fair and Attention Span: Fair  Recall:  Good  Fund of Knowledge: Good  Language: Good  Assets:  Desire for Improvement  ADL's:  Intact  Cognition: WNL  Prognosis:  Good    DIAGNOSES:    ICD-10-CM   1. Depression, major, recurrent, in partial remission (HCC)  F33.41     2. Melancholy  F32.A     3. Family illness  Z63.79        Receiving Psychotherapy: No    RECOMMENDATIONS:  PDMP reviewed. Last modafinil filled 11/22/2020. I provided 30 minutes of non-face-to-face time during this encounter, including time spent before and after the visit in records review, medical decision making, counseling pertinent to today's visit, and charting.  She seems to be doing well as far as her medications go.  No changes are necessary.  Cont Cymbalta 60 mg, 1 p.o. daily. Cont Modafinil 200 mg, 1/2 po q am.  As needed. Return in 6 months.  [name], PA-C

## 2021-12-30 ENCOUNTER — Telehealth: Payer: Self-pay | Admitting: Physician Assistant

## 2021-12-30 ENCOUNTER — Other Ambulatory Visit: Payer: Self-pay

## 2021-12-30 MED ORDER — MODAFINIL 200 MG PO TABS: ORAL_TABLET | ORAL | 2 refills | Status: DC

## 2021-12-30 NOTE — Telephone Encounter (Signed)
Pended.

## 2021-12-30 NOTE — Telephone Encounter (Signed)
Wendy Wright called to request refill of her Modafinil. Appt 02/10/22.  Send to [facility] on [address] in [place]

## 2022-02-10 ENCOUNTER — Ambulatory Visit (INDEPENDENT_AMBULATORY_CARE_PROVIDER_SITE_OTHER): Payer: BC Managed Care – PPO | Admitting: Physician Assistant

## 2022-02-10 ENCOUNTER — Encounter: Payer: Self-pay | Admitting: Physician Assistant

## 2022-02-10 DIAGNOSIS — F3341 Major depressive disorder, recurrent, in partial remission: Secondary | ICD-10-CM

## 2022-02-10 DIAGNOSIS — F900 Attention-deficit hyperactivity disorder, predominantly inattentive type: Secondary | ICD-10-CM

## 2022-02-10 DIAGNOSIS — Z6379 Other stressful life events affecting family and household: Secondary | ICD-10-CM

## 2022-02-10 MED ORDER — DULOXETINE HCL 60 MG PO CPEP: 60.0000 mg | ORAL_CAPSULE | Freq: Every day | ORAL | 1 refills | Status: DC

## 2022-02-10 MED ORDER — ATOMOXETINE HCL 25 MG PO CAPS: 25.0000 mg | ORAL_CAPSULE | Freq: Every day | ORAL | 1 refills | Status: DC

## 2022-02-10 NOTE — Progress Notes (Signed)
[facility]  Patient ID: Wendy Wright,  MRN: [id number]  PCP: Lauro Regulus, MD  Date of Evaluation: 02/10/2022 Time spent:30 minutes  Chief Complaint:  Chief Complaint   Depression; Follow-up     Virtual Visit via Telehealth  I connected with patient by telephone, with their informed consent, and verified patient privacy and that I am speaking with the correct person using two identifiers.  I am private, in my office and the patient is at her mom's home.  I discussed the limitations, risks, security and privacy concerns of performing an evaluation and management service by telephone and the availability of in person appointments. I also discussed with the patient that there may be a patient responsible charge related to this service. The patient expressed understanding and agreed to proceed.   I discussed the assessment and treatment plan with the patient. The patient was provided an opportunity to ask questions and all were answered. The patient agreed with the plan and demonstrated an understanding of the instructions.   The patient was advised to call back or seek an in-person evaluation if the symptoms worsen or if the condition fails to improve as anticipated.  I provided 30 minutes of non-face-to-face time during this encounter.  HISTORY/CURRENT STATUS: HPI For routine med check.  Takes the Modafinil when needed to keep her awake, and it does help. She takes 50 mg, that is enough. If she takes more than that, she gets irritable and angry. Also if she takes very often, gets kind of depressed.  Still has problems with focusing but doesn't know what else to do.   Patient denies loss of interest in usual activities and is able to enjoy things.  Denies decreased energy or motivation.  Appetite has not changed.  No extreme sadness, tearfulness, or feelings of hopelessness.  Sleeps okay most of the time. Not having anxiety very often, hasn't had to take the  hydroxyzine in a long time.  Denies suicidal or homicidal thoughts.  Patient denies increased energy with decreased need for sleep, no increased talkativeness, no racing thoughts, no impulsivity or risky behaviors, no increased spending, no increased libido, no grandiosity, no increased irritability or anger, and no hallucinations.  Denies dizziness, syncope, seizures, numbness, tingling, tremor, tics, unsteady gait, slurred speech, confusion. Denies muscle or joint pain, stiffness, or dystonia.  Individual Medical History/ Review of Systems: Changes? :No   Past medications for mental health diagnoses include: Cymbalta, Prozac, Lexapro, Zoloft, Paxil, Effexor, Wellbutrin, Ambien, Melatonin, Buspar, trazodone caused headache, Abilify caused drowsiness.  Allergies: Diclofenac and Trazodone and nefazodone  Current Medications:  Current Outpatient Medications:    atomoxetine (STRATTERA) 25 MG capsule, Take 1 capsule (25 mg total) by mouth daily., Disp: 30 capsule, Rfl: 1   fexofenadine (ALLEGRA) 180 MG tablet, Take 180 mg by mouth daily., Disp: , Rfl:    modafinil (PROVIGIL) 200 MG tablet, TAKE 1/2 (ONE-HALF) TABLET BY MOUTH IN THE MORNING (Patient taking differently: TAKE 1/4 (ONE-HALF) TABLET BY MOUTH IN THE MORNING, prn), Disp: 30 tablet, Rfl: 2   omeprazole (PRILOSEC) 20 MG capsule, Take 20 mg by mouth daily., Disp: , Rfl:    doxepin (SINEQUAN) 10 MG capsule, Take by mouth., Disp: , Rfl:    DULoxetine (CYMBALTA) 60 MG capsule, Take 1 capsule (60 mg total) by mouth daily., Disp: 90 capsule, Rfl: 1   estrogens, conjugated, (PREMARIN) 0.3 MG tablet, Take by mouth., Disp: , Rfl:  Medication Side Effects: none  Family Medical/ Social History: Changes?  Her mom has cancer and is doing pretty well.  MENTAL HEALTH EXAM:  There were no vitals taken for this visit.There is no height or weight on file to calculate BMI.  General Appearance:  Unable to assess  Eye Contact:   Unable to assess   Speech:  Clear and Coherent and Normal Rate  Volume:  Normal  Mood:  Euthymic  Affect:   Unable to assess  Thought Process:  Goal Directed and Descriptions of Associations: Circumstantial  Orientation:  Full (Time, Place, and Person)  Thought Content: Logical   Suicidal Thoughts:  No  Homicidal Thoughts:  No  Memory:  Immediate;   Fair Recent;   Fair  Judgement:  Good  Insight:  Good  Psychomotor Activity:   Unable to assess  Concentration:  Concentration: Poor and Attention Span: Fair  Recall:  Good  Fund of Knowledge: Good  Language: Good  Assets:  Desire for Improvement  ADL's:  Intact  Cognition: WNL  Prognosis:  Good    DIAGNOSES:    ICD-10-CM   1. Attention deficit hyperactivity disorder (ADHD), predominantly inattentive type  F90.0     2. Depression, major, recurrent, in partial remission (HCC)  F33.41     3. Family illness  Z63.79       Receiving Psychotherapy: No   RECOMMENDATIONS:  PDMP reviewed. Last modafinil filled 12/30/2021.   I provided 30 minutes of non-face-to-face time during this encounter, including time spent before and after the visit in records review, medical decision making, counseling pertinent to today's visit, and charting.   We discussed the lack of focus and the fact that even though modafinil help she is not able to take it very often.  Discussed Strattera.  Benefits, risks and side effects were discussed and she would like to try it.  Start Strattera 25 mg, 1 p.o. every morning. Cont Cymbalta 60 mg, 1 p.o. daily. Cont Modafinil 200 mg, 1/4 po q am.  As needed. Return in 4 to 6 weeks.  [name], PA-C

## 2022-03-10 ENCOUNTER — Telehealth: Payer: Self-pay | Admitting: Physician Assistant

## 2022-03-13 ENCOUNTER — Telehealth: Payer: Self-pay | Admitting: Physician Assistant

## 2022-03-13 NOTE — Telephone Encounter (Signed)
No, I'm sorry, I can only prescribe mental health medications.  Have her call her PCP. They might prescribe it.

## 2022-03-13 NOTE — Telephone Encounter (Signed)
Next visit is 04/13/22. Wendy Wright previously got her Premarin filled by another doctor. She can't afford to see this OB-GYN doctor as he costs to much as she is self-pay. She wants to know if Wendy Wright can refill her Premarin? Her phone number is [phone]. Pharmacy is:  [facility] [address] [place] - [address]  Phone:  [phone]  Fax:  [phone]

## 2022-03-13 NOTE — Telephone Encounter (Signed)
Noted  

## 2022-03-13 NOTE — Telephone Encounter (Signed)
Please advise 

## 2022-03-13 NOTE — Telephone Encounter (Signed)
Pt also stated she tried Medical sales representative and it was making things worse so she stopped med.It made her more depressed.She tried it for a week.

## 2022-04-09 ENCOUNTER — Telehealth: Payer: Self-pay | Admitting: Adult Health

## 2022-04-13 ENCOUNTER — Encounter: Payer: Self-pay | Admitting: Physician Assistant

## 2022-04-13 ENCOUNTER — Ambulatory Visit (INDEPENDENT_AMBULATORY_CARE_PROVIDER_SITE_OTHER): Payer: Self-pay | Admitting: Physician Assistant

## 2022-04-13 DIAGNOSIS — F900 Attention-deficit hyperactivity disorder, predominantly inattentive type: Secondary | ICD-10-CM

## 2022-04-13 DIAGNOSIS — F3341 Major depressive disorder, recurrent, in partial remission: Secondary | ICD-10-CM

## 2022-04-13 MED ORDER — ATOMOXETINE HCL 25 MG PO CAPS: 25.0000 mg | ORAL_CAPSULE | Freq: Every day | ORAL | 1 refills | Status: DC

## 2022-04-13 NOTE — Progress Notes (Signed)
[facility]  Patient ID: Wendy Wright,  MRN: [id number]  PCP: Lauro Regulus, MD  Date of Evaluation: 04/13/2022 Time spent:25 minutes  Chief Complaint:  Chief Complaint   Depression; ADD; Follow-up    Virtual Visit via Telehealth  I connected with patient by telephone, with their informed consent, and verified patient privacy and that I am speaking with the correct person using two identifiers.  I am private, in my office and the patient is at home.  I discussed the limitations, risks, security and privacy concerns of performing an evaluation and management service by telephone and the availability of in person appointments. I also discussed with the patient that there may be a patient responsible charge related to this service. The patient expressed understanding and agreed to proceed.   I discussed the assessment and treatment plan with the patient. The patient was provided an opportunity to ask questions and all were answered. The patient agreed with the plan and demonstrated an understanding of the instructions.   The patient was advised to call back or seek an in-person evaluation if the symptoms worsen or if the condition fails to improve as anticipated.  I provided 25 minutes of non-face-to-face time during this encounter.  HISTORY/CURRENT STATUS: HPI For routine med check.  She stopped Strattera b/c it made her depressed. Only took it a few days and didn't think it was worth it to keep trying it. Still has trouble focusing.   Has been under a lot of stress with work and home so they affect her mood. Has been sick w/ URI for the past 3 weeks. But is getting better. Energy and motivation are good most of the time.  Work is going well.   No extreme sadness, tearfulness, or feelings of hopelessness.  Sleeps ok. ADLs and personal hygiene are normal.  Appetite has not changed.  Weight is stable.  Does get anxious at times but more generalized and panic attacks.   Denies suicidal or homicidal thoughts.  Patient denies increased energy with decreased need for sleep, increased talkativeness, racing thoughts, impulsivity or risky behaviors, increased spending, increased libido, grandiosity, increased irritability or anger, paranoia, or hallucinations.  Denies dizziness, syncope, seizures, numbness, tingling, tremor, tics, unsteady gait, slurred speech, confusion. Denies muscle or joint pain, stiffness, or dystonia.  Individual Medical History/ Review of Systems: Changes? :No   Past medications for mental health diagnoses include: Cymbalta, Prozac, Lexapro, Zoloft, Paxil, Effexor, Wellbutrin, Ambien, Melatonin, Buspar, trazodone caused headache, Abilify caused drowsiness, Strattera caused depression but only took 2-3 days.  Allergies: Diclofenac and Trazodone and nefazodone  Current Medications:  Current Outpatient Medications:    DULoxetine (CYMBALTA) 60 MG capsule, Take 1 capsule (60 mg total) by mouth daily., Disp: 90 capsule, Rfl: 1   fexofenadine (ALLEGRA) 180 MG tablet, Take 180 mg by mouth daily., Disp: , Rfl:    omeprazole (PRILOSEC) 20 MG capsule, Take 20 mg by mouth daily., Disp: , Rfl:    atomoxetine (STRATTERA) 25 MG capsule, Take 1 capsule (25 mg total) by mouth daily., Disp: 30 capsule, Rfl: 1   doxepin (SINEQUAN) 10 MG capsule, Take by mouth., Disp: , Rfl:    estrogens, conjugated, (PREMARIN) 0.3 MG tablet, Take by mouth., Disp: , Rfl:    modafinil (PROVIGIL) 200 MG tablet, TAKE 1/2 (ONE-HALF) TABLET BY MOUTH IN THE MORNING (Patient not taking: Reported on 04/13/2022), Disp: 30 tablet, Rfl: 2 Medication Side Effects: none  Family Medical/ Social History: Changes?  Husband is closing his business.  MENTAL HEALTH EXAM:  There were no vitals taken for this visit.There is no height or weight on file to calculate BMI.  General Appearance:  Unable to assess  Eye Contact:   Unable to assess  Speech:  Clear and Coherent and Normal Rate   Volume:  Normal  Mood:  Euthymic  Affect:   Unable to assess  Thought Process:  Goal Directed and Descriptions of Associations: Circumstantial  Orientation:  Full (Time, Place, and Person)  Thought Content: Logical   Suicidal Thoughts:  No  Homicidal Thoughts:  No  Memory:  Immediate;   Fair Recent;   Fair  Judgement:  Good  Insight:  Good  Psychomotor Activity:   Unable to assess  Concentration:  Concentration: Poor and Attention Span: Fair  Recall:  Good  Fund of Knowledge: Good  Language: Good  Assets:  Desire for Improvement  ADL's:  Intact  Cognition: WNL  Prognosis:  Good   DIAGNOSES:    ICD-10-CM   1. Attention deficit hyperactivity disorder (ADHD), predominantly inattentive type  F90.0     2. Depression, major, recurrent, in partial remission (HCC)  F33.41       Receiving Psychotherapy: No   RECOMMENDATIONS:  PDMP reviewed. Last modafinil filled 12/30/2021.   I provided 25 minutes of non-face-to-face time during this encounter, including time spent before and after the visit in records review, medical decision making, counseling pertinent to today's visit, and charting.   She is willing to retry the Strattera.  She is off Premarin now because she is unable to go to the GYN and thinks that maybe the 2 meds did not mix.  So she will restart it and see how she does.  I have encouraged her to take it for 1 to 2 weeks before she gives up on it, unless of course she has rash or anaphylaxis.  Restart Strattera 25 mg, 1 p.o. every morning. Cont Cymbalta 60 mg, 1 p.o. daily. Return in 3 months.  [name], PA-C

## 2022-08-13 ENCOUNTER — Other Ambulatory Visit: Payer: Self-pay | Admitting: Physician Assistant

## 2022-08-13 NOTE — Telephone Encounter (Signed)
Please call to schedule an appt. Last due last month.

## 2022-08-14 NOTE — Telephone Encounter (Signed)
Pt is scheduled 09/02/22

## 2022-09-02 ENCOUNTER — Telehealth: Payer: 59 | Admitting: Physician Assistant

## 2022-09-09 ENCOUNTER — Telehealth (INDEPENDENT_AMBULATORY_CARE_PROVIDER_SITE_OTHER): Payer: 59 | Admitting: Physician Assistant

## 2022-09-09 ENCOUNTER — Encounter: Payer: Self-pay | Admitting: Physician Assistant

## 2022-09-09 DIAGNOSIS — F3341 Major depressive disorder, recurrent, in partial remission: Secondary | ICD-10-CM | POA: Diagnosis not present

## 2022-09-09 DIAGNOSIS — F900 Attention-deficit hyperactivity disorder, predominantly inattentive type: Secondary | ICD-10-CM

## 2022-09-09 MED ORDER — DULOXETINE HCL 60 MG PO CPEP: 60.0000 mg | ORAL_CAPSULE | Freq: Every day | ORAL | 3 refills | Status: DC

## 2022-09-09 MED ORDER — AMPHETAMINE-DEXTROAMPHETAMINE 10 MG PO TABS: 5.0000 mg | ORAL_TABLET | Freq: Two times a day (BID) | ORAL | 0 refills | Status: DC

## 2022-09-09 NOTE — Progress Notes (Signed)
[facility]  Patient ID: Wendy Wright,  MRN: [id number]  PCP: Kirk Ruths, MD  Date of Evaluation: 09/09/2022 Time spent:20 minutes  Chief Complaint:  Chief Complaint   Depression; ADD; Follow-up    Virtual Visit via Telehealth  I connected with patient by a video enabled telemedicine application with their informed consent, and verified patient privacy and that I am speaking with the correct person using two identifiers.  I am private, in my office and the patient is at work.  I discussed the limitations, risks, security and privacy concerns of performing an evaluation and management service by video and the availability of in person appointments. I also discussed with the patient that there may be a patient responsible charge related to this service. The patient expressed understanding and agreed to proceed.   I discussed the assessment and treatment plan with the patient. The patient was provided an opportunity to ask questions and all were answered. The patient agreed with the plan and demonstrated an understanding of the instructions.   The patient was advised to call back or seek an in-person evaluation if the symptoms worsen or if the condition fails to improve as anticipated.  I provided 20 minutes of non-face-to-face time during this encounter.  HISTORY/CURRENT STATUS: HPI For routine med check.  We retried the Strattera 6 mo ago. Made her more depressed, and angry again, so she stopped it. Still having probs w/ focus, attn, fatigue, and memory.  Does not feel like any things any worse but would appreciate improvement in those areas.  She tried modafinil last year which did help all of those symptoms but when she went above 50 mg she had irritability and trouble sleeping.  Patient is able to enjoy things.  Energy and motivation are good.  Work is going well.   No extreme sadness, tearfulness, or feelings of hopelessness.  Sleeps well most of the time.  ADLs and personal hygiene are normal.   Appetite has not changed.  Weight is stable.  Denies laxative use, calorie restricting, or binging and purging.   Denies cutting or any form of self-harm.  No complaints of anxiety for the most part.  Denies suicidal or homicidal thoughts.  Patient denies increased energy with decreased need for sleep, increased talkativeness, racing thoughts, impulsivity or risky behaviors, increased spending, increased libido, grandiosity, increased irritability or anger, paranoia, or hallucinations.  Denies dizziness, syncope, seizures, numbness, tingling, tremor, tics, unsteady gait, slurred speech, confusion. Denies muscle or joint pain, stiffness, or dystonia.  Individual Medical History/ Review of Systems: Changes? :No   Past medications for mental health diagnoses include: Cymbalta, Prozac, Lexapro, Zoloft, Paxil, Effexor, Wellbutrin, Ambien, Melatonin, Buspar, trazodone caused headache, Abilify caused drowsiness, Strattera caused depression and anger issues.   Allergies: Diclofenac and Trazodone and nefazodone  Current Medications:  Current Outpatient Medications:    amphetamine-dextroamphetamine (ADDERALL) 10 MG tablet, Take 0.5 tablets (5 mg total) by mouth 2 (two) times daily with breakfast and lunch. prn, Disp: 30 tablet, Rfl: 0   CINNAMON PO, Take by mouth., Disp: , Rfl:    fexofenadine (ALLEGRA) 180 MG tablet, Take 180 mg by mouth daily., Disp: , Rfl:    omeprazole (PRILOSEC) 20 MG capsule, Take 20 mg by mouth daily., Disp: , Rfl:    TURMERIC PO, Take by mouth., Disp: , Rfl:    doxepin (SINEQUAN) 10 MG capsule, Take by mouth., Disp: , Rfl:    DULoxetine (CYMBALTA) 60 MG capsule, Take 1 capsule (60 mg total)  by mouth daily., Disp: 90 capsule, Rfl: 3   estrogens, conjugated, (PREMARIN) 0.3 MG tablet, Take by mouth., Disp: , Rfl:  Medication Side Effects: none  Family Medical/ Social History: Changes?  None  MENTAL HEALTH EXAM:  There were no vitals  taken for this visit.There is no height or weight on file to calculate BMI.  General Appearance: Casual and Well Groomed  Eye Contact:  Good  Speech:  Clear and Coherent and Normal Rate  Volume:  Normal  Mood:  Euthymic  Affect:  Congruent  Thought Process:  Goal Directed and Descriptions of Associations: Circumstantial  Orientation:  Full (Time, Place, and Person)  Thought Content: Logical   Suicidal Thoughts:  No  Homicidal Thoughts:  No  Memory:  Immediate;   Fair Recent;   Fair  Judgement:  Good  Insight:  Good  Psychomotor Activity:  Normal  Concentration:  Concentration: Poor and Attention Span: Fair  Recall:  Good  Fund of Knowledge: Good  Language: Good  Assets:  Desire for Improvement  ADL's:  Intact  Cognition: WNL  Prognosis:  Good   DIAGNOSES:    ICD-10-CM   1. Attention deficit hyperactivity disorder (ADHD), predominantly inattentive type  F90.0     2. Depression, major, recurrent, in partial remission ([place])  F33.41       Receiving Psychotherapy: No   RECOMMENDATIONS:  PDMP reviewed. Last modafinil filled 12/30/2021.   I provided 20 minutes of non-face-to-face time during this encounter, including time spent before and after the visit in records review, medical decision making, counseling pertinent to today's visit, and charting.   We discussed adding Adderall, very low dose and short acting in hopes to help her symptoms.  Benefits, risks, side effects were discussed and she accepts.  We will reevaluate in 1 month to see how she has responded.  Start Adderall 10 mg, 1/2 pill at breakfast and 1/2 pill at lunch as needed. Cont Cymbalta 60 mg, 1 p.o. daily. Return in 4 weeks.  Donnal Moat, PA-C

## 2022-10-12 ENCOUNTER — Telehealth: Payer: Self-pay | Admitting: Physician Assistant

## 2022-10-12 NOTE — Telephone Encounter (Signed)
FYI: Please see message. I'm assuming the awful medication is Adderall since she has been on Cymbalta long-term.

## 2022-10-12 NOTE — Telephone Encounter (Signed)
Wendy Wright called at 11:25 to report that the medication was awful.  Things to be made worse.  Her insurance is running out so she doesn't want to trying anything else right now.  She also cancelled her appt due to insurance.  She will call back and re-start when she gets her insurance back.

## 2022-10-13 NOTE — Telephone Encounter (Signed)
Ok to stop the Adderall cold [place] but not Cymbalta, in case she plans to go off that too. I recommend she continue it, remind her about [facility] for that one, she has refills. Thanks

## 2022-10-13 NOTE — Telephone Encounter (Signed)
Patient stopped the Adderall, is continuing the Cymbalta.

## 2022-10-14 ENCOUNTER — Telehealth: Payer: 59 | Admitting: Physician Assistant

## 2022-10-14 NOTE — Telephone Encounter (Signed)
noted 

## 2023-02-06 IMAGING — MG MM DIGITAL SCREENING BILAT W/ TOMO AND CAD
6 of 10 series · 6 of 30 positions shown · non-contrast
Comparison: Previous exam(s).

CLINICAL DATA: Screening.

EXAM:
DIGITAL SCREENING BILATERAL MAMMOGRAM WITH TOMOSYNTHESIS AND CAD
TECHNIQUE: Bilateral screening digital craniocaudal and mediolateral oblique
mammograms were obtained. Bilateral screening digital breast
tomosynthesis was performed. The images were evaluated with
computer-aided detection.

[R MLO synth-2D (1 of 2)]
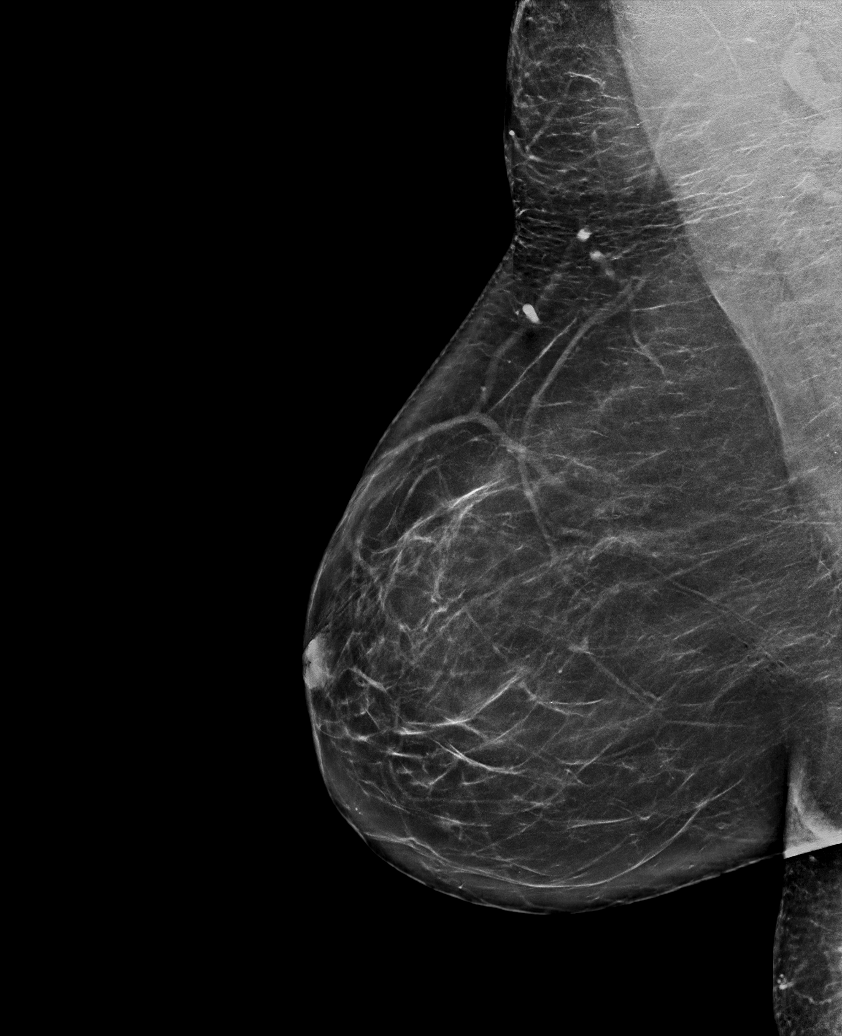

[R CC synth-2D]
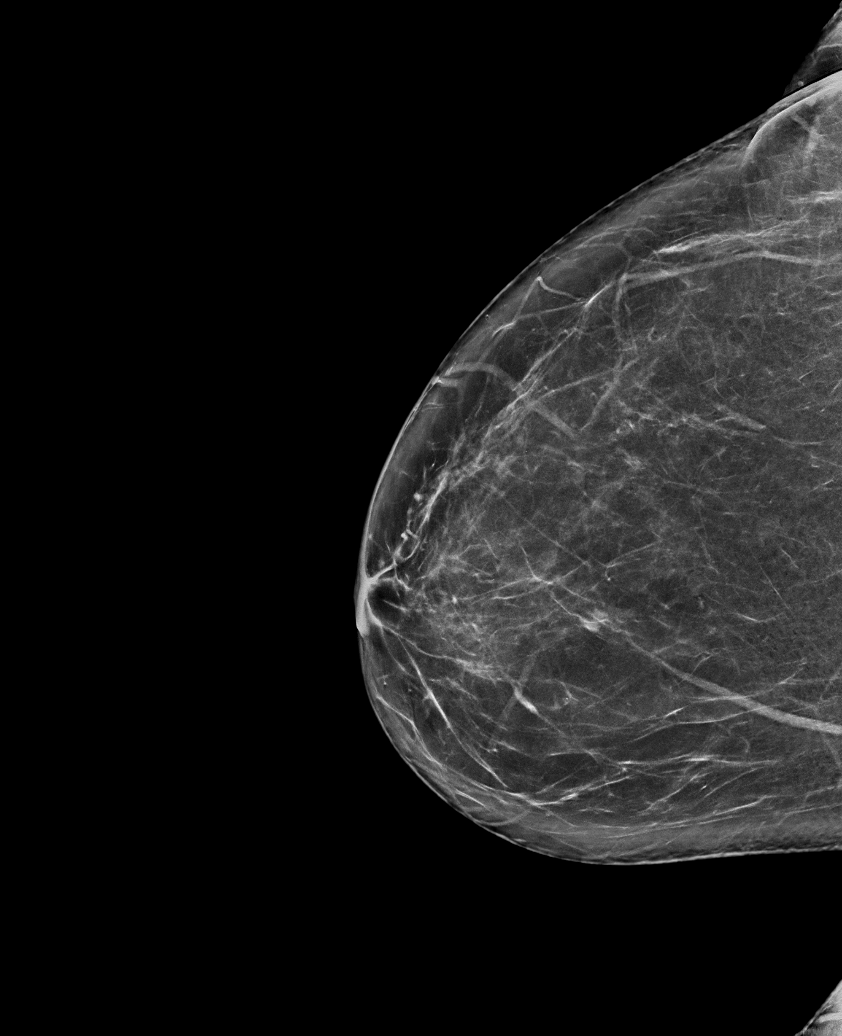

[R MLO synth-2D (2 of 2)]
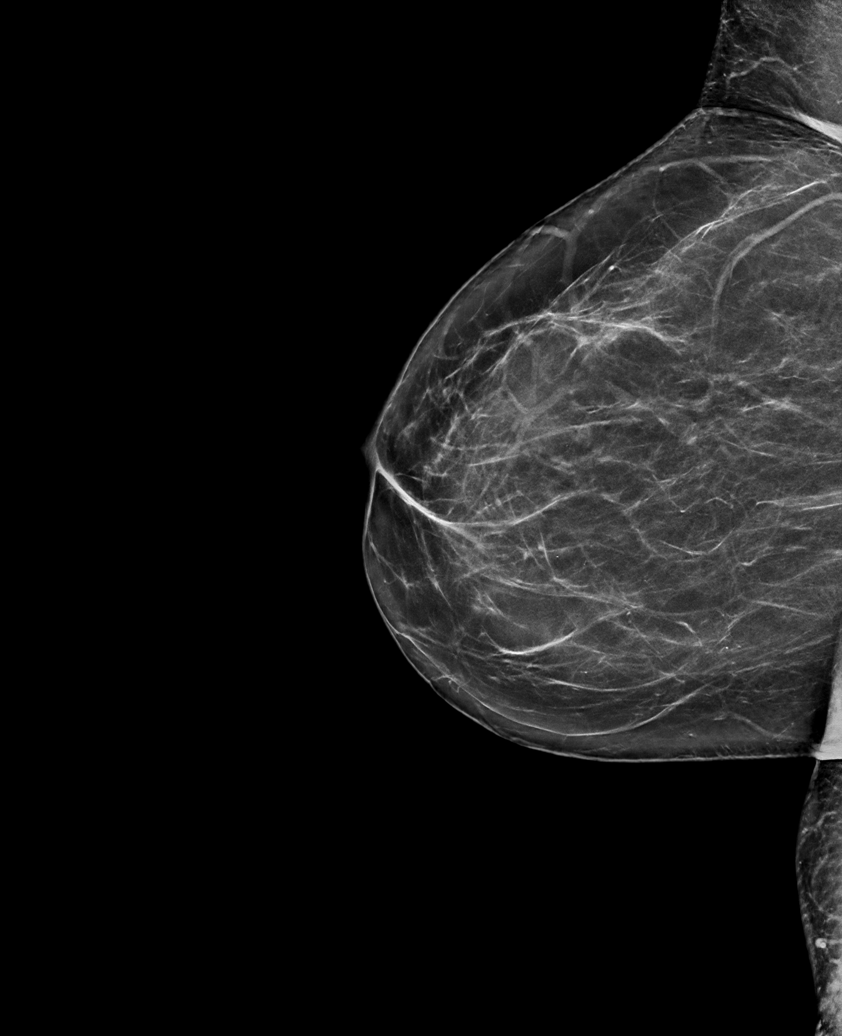

[L MLO synth-2D]
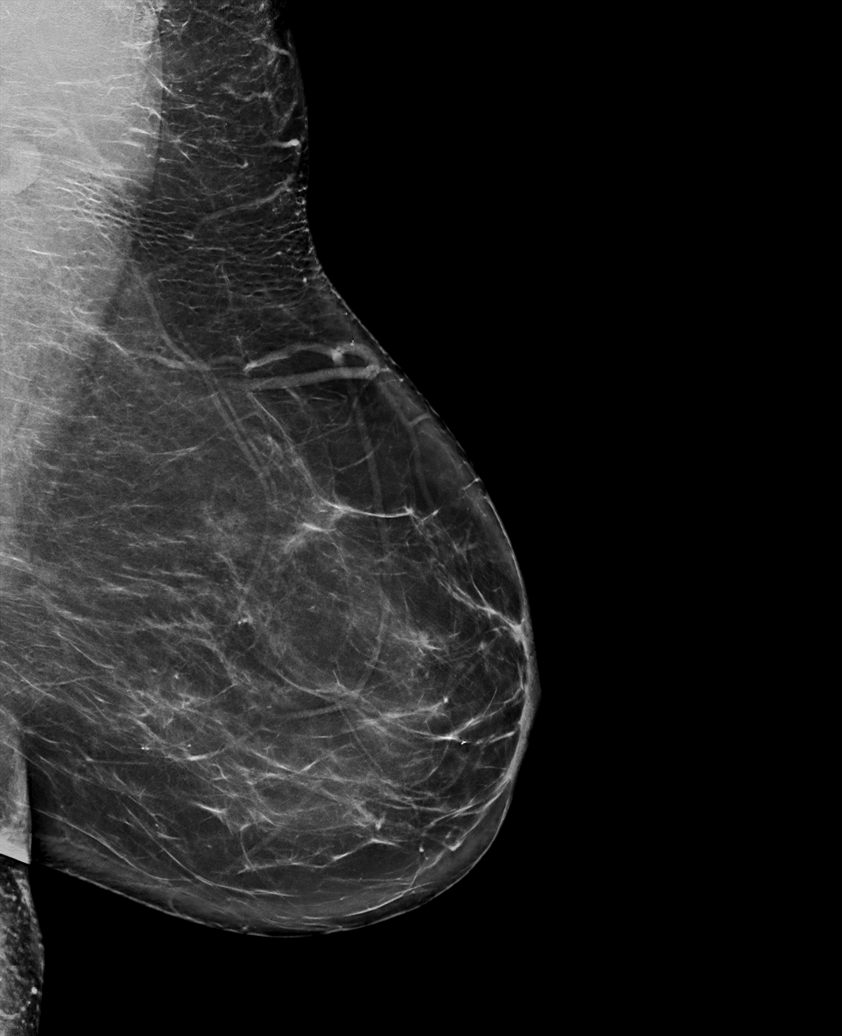

[L CC synth-2D]
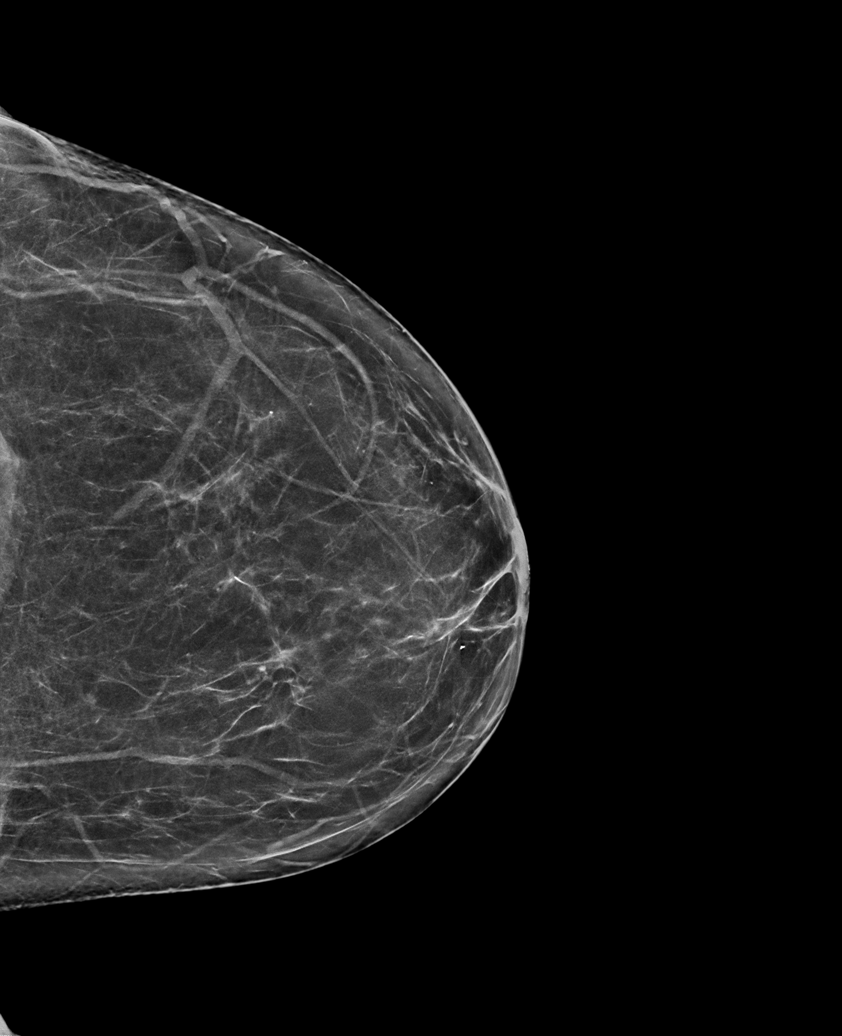

[R MLO tomo · tomo slice 43/86.0]
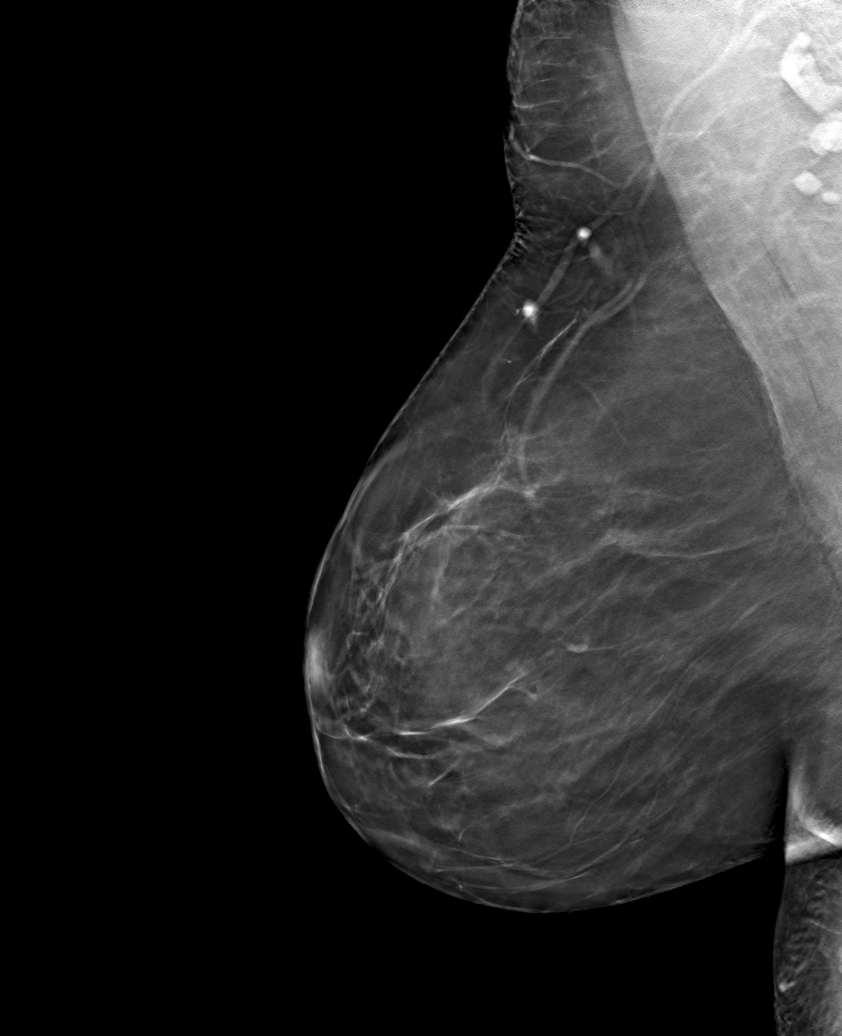

[6 of 30 positions shown; findings below may reference images not displayed]

ACR Breast Density Category b: There are scattered areas of
fibroglandular density.
FINDINGS: There are no findings suspicious for malignancy. The images were
evaluated with computer-aided detection.
IMPRESSION: No mammographic evidence of malignancy. A result letter of this
screening mammogram will be mailed directly to the patient.

RECOMMENDATION:
Screening mammogram in one year. (Code:[id number])

BI-RADS CATEGORY  1: Negative.

## 2023-09-08 ENCOUNTER — Other Ambulatory Visit: Payer: Self-pay | Admitting: Physician Assistant

## 2023-09-08 NOTE — Telephone Encounter (Signed)
 LV 09/09/22 PLEASE SCHEDULE PT AN APPT DUE BACK IN 4 WEEK.

## 2023-09-08 NOTE — Telephone Encounter (Signed)
 Just an FYI, since she's 10 months overdue for an appt, I'll only give 30 days with no RF and put a note she must be seen for more. Otherwise, she might not keep appts.

## 2023-10-14 ENCOUNTER — Other Ambulatory Visit: Payer: Self-pay | Admitting: Physician Assistant

## 2023-11-03 ENCOUNTER — Ambulatory Visit (INDEPENDENT_AMBULATORY_CARE_PROVIDER_SITE_OTHER): Payer: Self-pay | Admitting: Physician Assistant

## 2023-11-03 ENCOUNTER — Encounter: Payer: Self-pay | Admitting: Physician Assistant

## 2023-11-03 DIAGNOSIS — G4700 Insomnia, unspecified: Secondary | ICD-10-CM

## 2023-11-03 DIAGNOSIS — F3341 Major depressive disorder, recurrent, in partial remission: Secondary | ICD-10-CM

## 2023-11-03 MED ORDER — MIRTAZAPINE 15 MG PO TABS: 7.5000 mg | ORAL_TABLET | Freq: Every day | ORAL | 1 refills | Status: AC

## 2023-11-03 MED ORDER — DULOXETINE HCL 60 MG PO CPEP: 60.0000 mg | ORAL_CAPSULE | Freq: Every day | ORAL | 1 refills | Status: AC

## 2023-11-03 NOTE — Progress Notes (Signed)
 [facility]  Patient ID: Wendy Wright,  MRN: [id number]  PCP: Lauro Regulus, MD  Date of Evaluation: 11/03/2023 Time spent:25 minutes  Chief Complaint:  Chief Complaint   Insomnia    Virtual Visit via Telehealth  I connected with patient by phone with their informed consent, and verified patient privacy and that I am speaking with the correct person using two identifiers.  I am private, in my office and the patient is at home.  I discussed the limitations, risks, security and privacy concerns of performing an evaluation and management service by phone and the availability of in person appointments. I also discussed with the patient that there may be a patient responsible charge related to this service. The patient expressed understanding and agreed to proceed.   I discussed the assessment and treatment plan with the patient. The patient was provided an opportunity to ask questions and all were answered. The patient agreed with the plan and demonstrated an understanding of the instructions.   The patient was advised to call back or seek an in-person evaluation if the symptoms worsen or if the condition fails to improve as anticipated.  I provided approximately 25 minutes of non-face-to-face time during this encounter.  HISTORY/CURRENT STATUS: HPI 13 months overdue for appt.   Shawnelle complains of not being able to sleep.  States this has been going on since I prescribed Adderall in February 2024.  She took it only for a month and ever since then she has had trouble going to sleep.  She probably only gets 4 hours or so according to her husband.  Does not snore.  She states she might feel tired but then when she lies down, she is wide awake.  She does not remember sleeping at all.  The lack of sleep is not affecting her ability to work.  States she has associated nausea.  She has seen her PCP about the symptoms who said "they cannot do anything for me."  The patient has  not had a sleep study.   Feels tired a lot.    She is taking Cymbalta for depression which has helped.  Appetite is normal and weight is stable.  No extreme sadness, tearfulness, or feelings of hopelessness.  ADLs and personal hygiene are normal.   Denies any changes in concentration, making decisions, or remembering things.  Appetite has not changed.  Weight is stable.  No complaints of anxiety.  Denies suicidal or homicidal thoughts.  Patient denies increased energy with decreased need for sleep, increased talkativeness, racing thoughts, impulsivity or risky behaviors, increased spending, increased libido, grandiosity, increased irritability or anger, paranoia, or hallucinations.  Review of Systems  Constitutional:  Positive for malaise/fatigue.  HENT: Negative.    Eyes: Negative.   Respiratory: Negative.    Cardiovascular: Negative.   Gastrointestinal:        See HPI  Genitourinary: Negative.   Musculoskeletal: Negative.   Skin: Negative.   Neurological: Negative.   Endo/Heme/Allergies: Negative.   Psychiatric/Behavioral:         See HPI    Individual Medical History/ Review of Systems: Changes? :No   Past medications for mental health diagnoses include: Cymbalta, Prozac, Lexapro, Zoloft, Paxil, Effexor, Wellbutrin, Ambien, Melatonin, Buspar, trazodone caused headache, Abilify caused drowsiness, Strattera caused depression and anger issues.   Allergies: Diclofenac and Trazodone and nefazodone  Current Medications:  Current Outpatient Medications:    CINNAMON PO, Take by mouth., Disp: , Rfl:    fexofenadine (ALLEGRA) 180 MG  tablet, Take 180 mg by mouth daily., Disp: , Rfl:    mirtazapine (REMERON) 15 MG tablet, Take 0.5-1 tablets (7.5-15 mg total) by mouth at bedtime., Disp: 30 tablet, Rfl: 1   omeprazole (PRILOSEC) 20 MG capsule, Take 20 mg by mouth daily., Disp: , Rfl:    TURMERIC PO, Take by mouth., Disp: , Rfl:    doxepin (SINEQUAN) 10 MG capsule, Take by mouth., Disp: ,  Rfl:    DULoxetine (CYMBALTA) 60 MG capsule, Take 1 capsule (60 mg total) by mouth daily., Disp: 30 capsule, Rfl: 1   estrogens, conjugated, (PREMARIN) 0.3 MG tablet, Take by mouth., Disp: , Rfl:  Medication Side Effects: none  Family Medical/ Social History: Changes?  None  MENTAL HEALTH EXAM:  There were no vitals taken for this visit.There is no height or weight on file to calculate BMI.  General Appearance:  Unable to assess  Eye Contact:   Unable to assess  Speech:  Clear and Coherent and Normal Rate  Volume:  Normal  Mood:  Euthymic  Affect:   Unable to assess  Thought Process:  Goal Directed and Descriptions of Associations: Circumstantial  Orientation:  Full (Time, Place, and Person)  Thought Content: Logical   Suicidal Thoughts:  No  Homicidal Thoughts:  No  Memory:  Immediate;   Fair Recent;   Fair  Judgement:  Good  Insight:  Good  Psychomotor Activity:   Unable to assess  Concentration:  Concentration: Poor and Attention Span: Fair  Recall:  Good  Fund of Knowledge: Good  Language: Good  Assets:  Desire for Improvement  ADL's:  Intact  Cognition: WNL  Prognosis:  Good   DIAGNOSES:    ICD-10-CM   1. Insomnia, unspecified type  G47.00     2. Depression, major, recurrent, in partial remission (HCC)  F33.41       Receiving Psychotherapy: No   RECOMMENDATIONS:  PDMP reviewed. Last Adderall filled 09/09/2022. I provided approximately 25 minutes of non-face-to-face time during this encounter, including time spent before and after the visit in records review, medical decision making, counseling pertinent to today's visit, and charting.   Aneeka states the insomnia started when she took Adderall in February 2024 and has not improved after stopping it.  I recommend she see a sleep management, may need a sleep study.  She is unable to go.  "I cannot afford it."  Sleep hygiene discussed with her.  I recommend adding mirtazapine.  Benefits, risk and side effects were  discussed and she would like to try it.  She initially said she did not want any medication but then said she needs help.  Cont Cymbalta 60 mg, 1 p.o. daily. Start mirtazapine 15 mg, 1/2-1 p.o. nightly. Return in 6-8 weeks.  [name], PA-C
